# Patient Record
Sex: Female | Born: 1960 | Race: Black or African American | Hispanic: No | Smoking: Never smoker
Health system: Southern US, Community
[De-identification: ages and names within clinical notes are randomized; demographics above are authoritative.]

## PROBLEM LIST (undated history)

## (undated) DIAGNOSIS — K635 Polyp of colon: Secondary | ICD-10-CM

## (undated) DIAGNOSIS — R51 Headache: Secondary | ICD-10-CM

## (undated) DIAGNOSIS — T7840XA Allergy, unspecified, initial encounter: Secondary | ICD-10-CM

## (undated) DIAGNOSIS — H269 Unspecified cataract: Secondary | ICD-10-CM

## (undated) DIAGNOSIS — R0789 Other chest pain: Secondary | ICD-10-CM

## (undated) DIAGNOSIS — J45909 Unspecified asthma, uncomplicated: Secondary | ICD-10-CM

## (undated) DIAGNOSIS — F419 Anxiety disorder, unspecified: Secondary | ICD-10-CM

## (undated) DIAGNOSIS — N200 Calculus of kidney: Secondary | ICD-10-CM

## (undated) DIAGNOSIS — F32A Depression, unspecified: Secondary | ICD-10-CM

## (undated) DIAGNOSIS — F329 Major depressive disorder, single episode, unspecified: Secondary | ICD-10-CM

## (undated) HISTORY — DX: Anxiety disorder, unspecified: F41.9

## (undated) HISTORY — PX: COLONOSCOPY: SHX174

## (undated) HISTORY — DX: Allergy, unspecified, initial encounter: T78.40XA

## (undated) HISTORY — DX: Polyp of colon: K63.5

## (undated) HISTORY — DX: Unspecified cataract: H26.9

## (undated) HISTORY — PX: COLONOSCOPY W/ POLYPECTOMY: SHX1380

---

## 1985-01-14 HISTORY — PX: TUBAL LIGATION: SHX77

## 1997-01-14 HISTORY — PX: BUNIONECTOMY: SHX129

## 1999-04-12 ENCOUNTER — Encounter: Admission: RE | Admit: 1999-04-12 | Discharge: 1999-04-12 | Payer: Self-pay | Admitting: Family Medicine

## 1999-04-12 ENCOUNTER — Encounter: Payer: Self-pay | Admitting: Family Medicine

## 2001-09-07 ENCOUNTER — Encounter: Payer: Self-pay | Admitting: *Deleted

## 2001-09-07 ENCOUNTER — Encounter: Admission: RE | Admit: 2001-09-07 | Discharge: 2001-09-07 | Payer: Self-pay | Admitting: *Deleted

## 2002-09-09 ENCOUNTER — Encounter: Payer: Self-pay | Admitting: Emergency Medicine

## 2002-09-09 ENCOUNTER — Encounter: Admission: RE | Admit: 2002-09-09 | Discharge: 2002-09-09 | Payer: Self-pay | Admitting: Emergency Medicine

## 2003-09-15 ENCOUNTER — Encounter: Admission: RE | Admit: 2003-09-15 | Discharge: 2003-09-15 | Payer: Self-pay | Admitting: Internal Medicine

## 2004-09-14 ENCOUNTER — Encounter: Admission: RE | Admit: 2004-09-14 | Discharge: 2004-09-14 | Payer: Self-pay | Admitting: Emergency Medicine

## 2005-01-14 HISTORY — PX: CERVICAL POLYPECTOMY: SHX88

## 2005-09-18 ENCOUNTER — Encounter: Admission: RE | Admit: 2005-09-18 | Discharge: 2005-09-18 | Payer: Self-pay | Admitting: Family Medicine

## 2005-09-26 ENCOUNTER — Encounter: Admission: RE | Admit: 2005-09-26 | Discharge: 2005-09-26 | Payer: Self-pay | Admitting: Family Medicine

## 2006-03-19 ENCOUNTER — Ambulatory Visit (HOSPITAL_COMMUNITY): Admission: RE | Admit: 2006-03-19 | Discharge: 2006-03-19 | Payer: Self-pay | Admitting: Obstetrics and Gynecology

## 2006-03-19 ENCOUNTER — Encounter (INDEPENDENT_AMBULATORY_CARE_PROVIDER_SITE_OTHER): Payer: Self-pay | Admitting: Specialist

## 2006-09-23 ENCOUNTER — Encounter: Admission: RE | Admit: 2006-09-23 | Discharge: 2006-09-23 | Payer: Self-pay | Admitting: Internal Medicine

## 2007-09-24 ENCOUNTER — Encounter: Admission: RE | Admit: 2007-09-24 | Discharge: 2007-09-24 | Payer: Self-pay | Admitting: Emergency Medicine

## 2008-09-26 ENCOUNTER — Encounter: Admission: RE | Admit: 2008-09-26 | Discharge: 2008-09-26 | Payer: Self-pay | Admitting: Emergency Medicine

## 2009-09-27 ENCOUNTER — Encounter: Admission: RE | Admit: 2009-09-27 | Discharge: 2009-09-27 | Payer: Self-pay | Admitting: Emergency Medicine

## 2010-02-04 ENCOUNTER — Encounter: Payer: Self-pay | Admitting: Family Medicine

## 2010-04-15 DIAGNOSIS — R87619 Unspecified abnormal cytological findings in specimens from cervix uteri: Secondary | ICD-10-CM | POA: Insufficient documentation

## 2010-06-01 NOTE — Consult Note (Signed)
NAME:  Chelsea Mckay, [name]                ACCOUNT NO.:  [id number]   MEDICAL RECORD NO.:  [id number]          PATIENT TYPE:  AMB   LOCATION:                                FACILITY:  [facility]   PHYSICIAN:  Naima A. Dillard, M.D. DATE OF BIRTH:  08/05/1960   DATE OF CONSULTATION:  DATE OF DISCHARGE:                                 CONSULTATION   CHIEF COMPLAINT:  Endometrial endocervical polyp.   HISTORY OF PRESENT ILLNESS:  The patient is a 50 year old female who  came in because of cervical polyp that was found on exam by her primary  care physician.  She has some spotting after intercourse; otherwise no  irregular vaginal bleeding,.   PAST MEDICAL HISTORY:  Unremarkable.   MEDICATIONS:  Allegra as needed.   ALLERGIES:  No known drug allergies.   FAMILY HISTORY:  Significant for two sisters with breast cancer and  mother with diabetes.   PAST SURGICAL HISTORY:  Significant for tubal ligation, cryo and  bilateral foot surgery.   PAST MEDICAL HISTORY:  1. Vaginal delivery x3 without complaint.  2. Menarche occurred age 7, occurring monthly, lasting for five to      six days.   REVIEW OF SYSTEMS:  Significant for varicose veins. GU:  As above.  MUSCULOSKELETAL:  No muscle weakness.  RESPIRATORY:  No asthma.   PHYSICAL EXAMINATION:  VITAL SIGNS:  Weight 170 pounds, blood pressure  130/74.  HEENT:  Pupils are equal.  Hearing is normal.  Throat is clear.  NECK:  Thyroid is not enlarged.  HEART:  Regular rate and rhythm.  CHEST:  Clear to auscultation bilaterally.  BREASTS:  No masses, discharge, skin changes, or nipple retraction.  BACK:  No CVA tenderness bilaterally.  ABDOMEN:  Nontender without any masses or organomegaly.  EXTREMITIES:  No cyanosis, clubbing or edema.  NEUROLOGIC:  Within normal limits.  __________ vaginal exam is within normal limits.  Cervix is nontender  with a small endocervical polyp.  Uterus is normal in shape, size and  consistency.  Adnexa has no  masses.   The patient had a sonohystogram and ultrasound done.  Sonohystogram was  significant for posterior polyp that measured 0.6 cm.  Her ultrasound  was significant for uterus measuring 8.56 x 4.86 x 3.84. Both ovaries  were normal and the patient has four noted fibroids, the largest being  1.69 cm in size.  Cervical polypectomy was benign.   ASSESSMENT:  Uterine polyps.   PLAN:  Dilatation and curettage hysteroscopy, polypectomy.  The patient  understands the risks are but not limited to bleeding, infection,  perforation of the uterus, damage to internal organs such as bowel,  bladder, major blood vessels.      Naima A. Normand Sloop, M.D.  Electronically Signed     NAD/MEDQ  D:  03/19/2006  T:  03/19/2006  Job:  [id number]

## 2010-06-01 NOTE — Op Note (Signed)
NAME:  Chelsea Mckay, Chelsea Mckay                ACCOUNT NO.:  [id number]   MEDICAL RECORD NO.:  [id number]          PATIENT TYPE:  AMB   LOCATION:  [facility]                           FACILITY:  [facility]   PHYSICIAN:  Naima A. Dillard, M.D. DATE OF BIRTH:  [birth year]   DATE OF PROCEDURE:  03/19/2006  DATE OF DISCHARGE:                               OPERATIVE REPORT   PREOPERATIVE DIAGNOSES:  Uterine polyp and irregular vaginal bleeding.   POSTOPERATIVE DIAGNOSES:  Uterine polyp and irregular vaginal bleeding.   PROCEDURE:  Dilatation and curettage, hysteroscopy, polypectomy.   SURGEON:  Naima A. Dillard, M.D.   ASSISTANT:  None.   ANESTHESIA:  General laryngeal mask airway.   SPECIMENS:  Endometrial polyp and endometrial curettings sent to  pathology.   ESTIMATED BLOOD LOSS:  Minimal.   COMPLICATIONS:  None.  The patient went to PACU in stable condition.   DESCRIPTION OF PROCEDURE:  The patient was taken to the operating room  where she was given general anesthesia and placed in the dorsal  lithotomy position and prepped and draped in a normal sterile fashion.  The patient was noted to have an anteverted uterus with no adnexal  masses on examination under anesthesia.  Her bladder had been drained  with a straight catheter.  A bivalve speculum was placed into the  vagina.  The anterior lip of the cervix was grasped with a single tooth  tenaculum and 20 mL of one percent lidocaine used  for a cervical block.  The uterus was sounded to 9 cm.  The cervix was further dilated with  Shawnie Pons dilators of #21.  A hysteroscope was placed into the uterine  cavity.  Both ostia were visualized.  There was a small polyp on the  posterior aspect of the uterus.  The anterior uterine wall was normal.  Besides the polyp, there were no submucosal fibroids or other  abnormalities noted.  The polyp forceps were placed into the uterine  cavity and the polyp was removed.  A sharp curettage was done.  The  hysteroscope was  placed back into the uterine cavity and the polyp had  been successfully removed.  A second endometrial curettings were  obtained and sent to pathology.  All instruments were removed  from the vagina.  The sponge, lap and needle counts were correct.  The  tenaculum site on the patient's right side had some bleeding, which was  stopped with silver nitrate and pressure.   The patient went to the recovery room in stable condition.      Naima A. Normand Sloop, M.D.  Electronically Signed     NAD/MEDQ  D:  03/19/2006  T:  03/19/2006  Job:  [id number]

## 2010-08-24 ENCOUNTER — Other Ambulatory Visit: Payer: Self-pay | Admitting: Internal Medicine

## 2010-08-24 ENCOUNTER — Other Ambulatory Visit: Payer: Self-pay | Admitting: Emergency Medicine

## 2010-08-24 DIAGNOSIS — Z1231 Encounter for screening mammogram for malignant neoplasm of breast: Secondary | ICD-10-CM

## 2010-10-03 ENCOUNTER — Ambulatory Visit
Admission: RE | Admit: 2010-10-03 | Discharge: 2010-10-03 | Disposition: A | Discharge status: Home / Self Care | Payer: BC Managed Care – PPO | Source: Ambulatory Visit | Attending: Internal Medicine | Admitting: Internal Medicine

## 2010-10-03 DIAGNOSIS — Z1231 Encounter for screening mammogram for malignant neoplasm of breast: Secondary | ICD-10-CM

## 2011-02-11 ENCOUNTER — Ambulatory Visit (INDEPENDENT_AMBULATORY_CARE_PROVIDER_SITE_OTHER): Payer: BC Managed Care – PPO

## 2011-02-11 DIAGNOSIS — M542 Cervicalgia: Secondary | ICD-10-CM

## 2011-02-11 DIAGNOSIS — R319 Hematuria, unspecified: Secondary | ICD-10-CM

## 2011-07-28 ENCOUNTER — Other Ambulatory Visit: Payer: Self-pay

## 2011-07-28 MED ORDER — FEXOFENADINE HCL 180 MG PO TABS: 180.0000 mg | ORAL_TABLET | Freq: Every day | ORAL | Status: DC

## 2011-07-31 ENCOUNTER — Other Ambulatory Visit: Payer: Self-pay

## 2011-08-05 ENCOUNTER — Other Ambulatory Visit: Payer: Self-pay | Admitting: Obstetrics and Gynecology

## 2011-08-05 NOTE — Telephone Encounter (Signed)
Spoke with pt rgd msg wants refill on a cream for itching written in 2011 advised pt need ov offered pt an appt pt declined appt at this time

## 2011-08-05 NOTE — Telephone Encounter (Signed)
Niccole/nd pt °

## 2011-08-05 NOTE — Telephone Encounter (Signed)
Lm on vm tcb rgd msg 

## 2011-11-11 ENCOUNTER — Other Ambulatory Visit: Payer: Self-pay | Admitting: Emergency Medicine

## 2011-11-11 DIAGNOSIS — Z1231 Encounter for screening mammogram for malignant neoplasm of breast: Secondary | ICD-10-CM

## 2011-11-28 ENCOUNTER — Ambulatory Visit (INDEPENDENT_AMBULATORY_CARE_PROVIDER_SITE_OTHER): Payer: BC Managed Care – PPO | Admitting: Physician Assistant

## 2011-11-28 ENCOUNTER — Encounter: Payer: Self-pay | Admitting: Physician Assistant

## 2011-11-28 VITALS — BP 124/77 | HR 81 | Temp 98.4°F | Resp 16 | Ht 65.5 in | Wt 170.0 lb

## 2011-11-28 DIAGNOSIS — B353 Tinea pedis: Secondary | ICD-10-CM

## 2011-11-28 DIAGNOSIS — F430 Acute stress reaction: Secondary | ICD-10-CM

## 2011-11-28 DIAGNOSIS — R319 Hematuria, unspecified: Secondary | ICD-10-CM

## 2011-11-28 DIAGNOSIS — N029 Recurrent and persistent hematuria with unspecified morphologic changes: Secondary | ICD-10-CM

## 2011-11-28 DIAGNOSIS — J309 Allergic rhinitis, unspecified: Secondary | ICD-10-CM | POA: Insufficient documentation

## 2011-11-28 DIAGNOSIS — R739 Hyperglycemia, unspecified: Secondary | ICD-10-CM

## 2011-11-28 DIAGNOSIS — Z0000 Encounter for general adult medical examination without abnormal findings: Secondary | ICD-10-CM

## 2011-11-28 DIAGNOSIS — R7309 Other abnormal glucose: Secondary | ICD-10-CM

## 2011-11-28 LAB — POCT UA - MICROSCOPIC ONLY
Bacteria, U Microscopic: NEGATIVE
Mucus, UA: NEGATIVE
Yeast, UA: NEGATIVE

## 2011-11-28 LAB — CBC AND DIFFERENTIAL
Neutrophils Absolute: 2695 /uL
Platelets: 240 10*3/uL (ref 150–399)

## 2011-11-28 LAB — TSH: TSH: 1.4 u[IU]/mL (ref 0.41–5.90)

## 2011-11-28 LAB — BASIC METABOLIC PANEL
BUN: 13 mg/dL (ref 4–21)
Creatinine: 1 mg/dL (ref 0.5–1.1)
Glucose: 87 mg/dL

## 2011-11-28 LAB — POCT URINALYSIS DIPSTICK
Bilirubin, UA: NEGATIVE
Ketones, UA: NEGATIVE
Leukocytes, UA: NEGATIVE
Spec Grav, UA: >=1.03
pH, UA: 5

## 2011-11-28 LAB — LIPID PANEL: LDL Cholesterol: 109 mg/dL

## 2011-11-28 MED ORDER — NYSTATIN 100000 UNIT/GM EX POWD: Freq: Four times a day (QID) | CUTANEOUS | Status: DC

## 2011-11-28 NOTE — Progress Notes (Signed)
Subjective:    Patient ID: Chelsea Mckay, female    DOB: September 10, 1960, 51 y.o.   MRN: [id number]  HPI This 51 y.o. female presents for Annual Wellness exam.  Breast/pap performed with GYN (her appointment is in the next few weeks).    Review of Systems  Constitutional: Negative.   HENT: Positive for congestion, rhinorrhea and postnasal drip. Negative for hearing loss, ear pain, nosebleeds, sore throat, facial swelling, sneezing, drooling, mouth sores, trouble swallowing, neck pain, neck stiffness, dental problem, voice change, sinus pressure, tinnitus and ear discharge.   Eyes: Positive for discharge (watery) and itching. Negative for photophobia, pain, redness and visual disturbance.       Uses Allegra "when I need it," but hasn't been using it recently with her current symptoms.  Respiratory: Negative.   Cardiovascular: Negative.   Gastrointestinal: Negative.   Genitourinary: Negative.   Musculoskeletal: Negative.   Skin: Negative for color change, pallor, rash and wound.       Longstanding thick callous and peeling skin on both feet.  Notes a foul odor over the past few months.  Neurological: Negative.   Hematological: Negative.   Psychiatric/Behavioral: Positive for dysphoric mood (her husband died unexpectedly 2 weeks ago). Negative for suicidal ideas, hallucinations, behavioral problems, confusion, sleep disturbance, self-injury, decreased concentration and agitation. The patient is not nervous/anxious and is not hyperactive.       Past Medical History  Diagnosis Date  . Allergy     Past Surgical History  Procedure Date  . Tubal ligation 1987  . Bunionectomy 1999  . Cervical polypectomy 2007    Prior to Admission medications   Medication Sig Start Date End Date Taking? Authorizing Provider  fexofenadine (ALLEGRA) 180 MG tablet Take 1 tablet (180 mg total) by mouth daily. 07/28/11 07/27/12 Yes Jamon [place] Tessa Lerner, PA-C    Allergies  Allergen Reactions  . Adhesive (Tape)  Rash    History   Social History  . Marital Status: Widowed    Spouse Name: Shawn    Number of Children: 3  . Years of Education: 12   Occupational History  . Document Control Specialist    Social History Main Topics  . Smoking status: Never Smoker   . Smokeless tobacco: Never Used  . Alcohol Use: No  . Drug Use: No  . Sexually Active: Not Currently -- Female partner(s)   Other Topics Concern  . Not on file   Social History Narrative   Lives alone. Husband died unexpectedly of a heart attack at work 11/15/2011. Good support from pastor and daughters.    Family History  Problem Relation Age of Onset  . Diabetes Mother   . Alzheimer's disease Mother   . Cancer Sister   . Diabetes Brother   . Heart disease Father   . Cancer Sister 106    breast ca  . Diabetes Sister   . Hypertension Sister   . Cancer Sister 78    breast ca  . Diabetes Brother   . Cancer Brother 95    prostate  . Cancer Brother 31    prostate ca       Objective:   Physical Exam  Vitals reviewed. Constitutional: She is oriented to person, place, and time. Vital signs are normal. She appears well-developed and well-nourished. No distress.       Appears sad, becomes tearful when talking about her husband's recent death.  HENT:  Head: Normocephalic and atraumatic.  Right Ear: Hearing, tympanic membrane, external ear and  ear canal normal. No foreign bodies.  Left Ear: Hearing, tympanic membrane, external ear and ear canal normal. No foreign bodies.  Nose: Nose normal.  Mouth/Throat: Uvula is midline, oropharynx is clear and moist and mucous membranes are normal. No oral lesions. Normal dentition. No dental abscesses or uvula swelling. No oropharyngeal exudate.  Eyes: Conjunctivae normal and EOM are normal. Pupils are equal, round, and reactive to light. Right eye exhibits no discharge. Left eye exhibits no discharge. No scleral icterus.  Fundoscopic exam:      The right eye shows no arteriolar  narrowing, no AV nicking, no exudate, no hemorrhage and no papilledema. The right eye shows red reflex.The right eye shows no venous pulsations.      The left eye shows no arteriolar narrowing, no AV nicking, no exudate, no hemorrhage and no papilledema. The left eye shows red reflex.The left eye shows no venous pulsations. Neck: Trachea normal, normal range of motion and full passive range of motion without pain. Neck supple. No spinous process tenderness and no muscular tenderness present. No mass and no thyromegaly present.  Cardiovascular: Normal rate, regular rhythm, normal heart sounds, intact distal pulses and normal pulses.   Pulmonary/Chest: Effort normal and breath sounds normal.  Genitourinary: Rectum normal.  Musculoskeletal: She exhibits no edema and no tenderness.       Cervical back: Normal.       Thoracic back: Normal.       Lumbar back: Normal.  Lymphadenopathy:       Head (right side): No tonsillar, no preauricular, no posterior auricular and no occipital adenopathy present.       Head (left side): No tonsillar, no preauricular, no posterior auricular and no occipital adenopathy present.    She has no cervical adenopathy.       Right: No supraclavicular adenopathy present.       Left: No supraclavicular adenopathy present.  Neurological: She is alert and oriented to person, place, and time. She has normal strength and normal reflexes. No cranial nerve deficit. She exhibits normal muscle tone. Coordination and gait normal.  Skin: Skin is warm, dry and intact. No rash noted. She is not diaphoretic. No cyanosis or erythema. Nails show no clubbing.  Psychiatric: She has a normal mood and affect. Her speech is normal and behavior is normal. Judgment and thought content normal.    Results for orders placed in visit on 11/28/11  POCT URINALYSIS DIPSTICK      Component Value Range   Color, UA yellow     Clarity, UA clear     Glucose, UA neg     Bilirubin, UA neg     Ketones, UA  neg     Spec Grav, UA >=1.030     Blood, UA large     pH, UA 5.0     Protein, UA trace     Urobilinogen, UA 0.2     Nitrite, UA neg     Leukocytes, UA Negative    POCT UA - MICROSCOPIC ONLY      Component Value Range   WBC, Ur, HPF, POC 0-1     RBC, urine, microscopic 2-8     Bacteria, U Microscopic neg     Mucus, UA neg     Epithelial cells, urine per micros 0-6     Crystals, Ur, HPF, POC neg     Casts, Ur, LPF, POC neg     Yeast, UA neg         Assessment &  Plan:   1. Routine general medical examination at a health care facility  Age appropriate anticipatory guidance provided.   2. AR (allergic rhinitis)  Encouraged daily use of Allegra.  If symptoms persist, would add steroid nasal spray.  3. Benign hematuria  stable  4. Hyperglycemia  Await today's labs.    5. Tinea pedis  nystatin (MYCOSTATIN) powder  6. Reaction, situational, acute, to stress  Declined referral for therapy today.  Has excellent support.  Contact me if needed.

## 2011-11-28 NOTE — Patient Instructions (Signed)

## 2011-11-30 LAB — HEPATIC FUNCTION PANEL
ALT: 11 U/L (ref 7–35)
AST: 15 U/L (ref 13–35)
Alkaline Phosphatase: 70 U/L (ref 25–125)
Bilirubin, Total: 1.3 mg/dL

## 2011-12-04 ENCOUNTER — Encounter: Payer: Self-pay | Admitting: Physician Assistant

## 2011-12-16 ENCOUNTER — Ambulatory Visit
Admission: RE | Admit: 2011-12-16 | Discharge: 2011-12-16 | Disposition: A | Discharge status: Home / Self Care | Payer: BC Managed Care – PPO | Source: Ambulatory Visit | Attending: Emergency Medicine | Admitting: Emergency Medicine

## 2011-12-16 DIAGNOSIS — Z1231 Encounter for screening mammogram for malignant neoplasm of breast: Secondary | ICD-10-CM

## 2011-12-17 ENCOUNTER — Encounter: Payer: Self-pay | Admitting: Obstetrics and Gynecology

## 2011-12-17 ENCOUNTER — Ambulatory Visit (INDEPENDENT_AMBULATORY_CARE_PROVIDER_SITE_OTHER): Payer: BC Managed Care – PPO | Admitting: Obstetrics and Gynecology

## 2011-12-17 VITALS — BP 120/80 | Ht 65.0 in | Wt 170.0 lb

## 2011-12-17 DIAGNOSIS — N63 Unspecified lump in unspecified breast: Secondary | ICD-10-CM

## 2011-12-17 DIAGNOSIS — Z0000 Encounter for general adult medical examination without abnormal findings: Secondary | ICD-10-CM

## 2011-12-17 DIAGNOSIS — R21 Rash and other nonspecific skin eruption: Secondary | ICD-10-CM

## 2011-12-17 DIAGNOSIS — N6310 Unspecified lump in the right breast, unspecified quadrant: Secondary | ICD-10-CM

## 2011-12-17 DIAGNOSIS — Z124 Encounter for screening for malignant neoplasm of cervix: Secondary | ICD-10-CM

## 2011-12-17 MED ORDER — CLOTRIMAZOLE-BETAMETHASONE 1-0.05 % EX CREA: TOPICAL_CREAM | Freq: Two times a day (BID) | CUTANEOUS | Status: DC

## 2011-12-17 NOTE — Progress Notes (Signed)
Last Pap: 11/201 WNL: Yes Regular Periods:yes Contraception: n/a  Monthly Breast exam:yes Tetanus<53yrs:yes Nl.Bladder Function:yes Daily BMs:yes Healthy Diet:yes Calcium:yes Mammogram:yes Date of Mammogram: 12/16/11 have to re scan pt will call to schd Exercise:yes Have often Exercise: walk Seatbelt: yes Abuse at home: no Stressful work:no Sigmoid-colonoscopy: 2012 polyp re check in 3 yrs  Bone Density: No PCP: Chelle Jeffreies PAC Change in PMH: no change Change in FMH:no change BP 120/80  Ht 5\' 5"  (1.651 m)  Wt 170 lb (77.111 kg)  BMI 28.29 kg/m2  LMP 11/27/2011 Pt with complaints:yes she has occ missed period.  No other sxs.  She needs lotrisone for thigh rash and she needs to f/u at the breast center Physical Examination: General appearance - alert, well appearing, and in no distress Mental status - normal mood, behavior, speech, dress, motor activity, and thought processes Neck - supple, no significant adenopathy,  thyroid exam: thyroid is normal in size without nodules or tenderness Chest - clear to auscultation, no wheezes, rales or rhonchi, symmetric air entry Heart - normal rate and regular rhythm Abdomen - soft, nontender, nondistended, no masses or organomegaly Breasts - breasts appear normal, no suspicious masses, no skin or nipple changes or axillary nodes Pelvic - normal external genitalia, vulva, vagina, cervix, uterus and adnexa Rectal - declined by the patient Back exam - full range of motion, no tenderness, palpable spasm or pain on motion Neurological - alert, oriented, normal speech, no focal findings or movement disorder noted Musculoskeletal - no joint tenderness, deformity or swelling Extremities - no edema, redness or tenderness in the calves or thighs Skin - normal coloration and turgor, no rashes, no suspicious skin lesions noted Routine exam Will schedule follow up mamogram lotrisone prn rash on thigh Pt perimenopausal Pap sent yes Mammogram  due yes pt due for follow up of ? Mass in right breast abstinance used for contraception RT 1 yr

## 2011-12-17 NOTE — Patient Instructions (Signed)
Perimenopause Perimenopause is the time when your body begins to move into the menopause (no menstrual period for 12 straight months). It is a natural process. Perimenopause can begin 2 to 8 years before the menopause and usually lasts for one year after the menopause. During this time, your ovaries may or may not produce an egg. The ovaries vary in their production of estrogen and progesterone hormones each month. This can cause irregular menstrual periods, difficulty in getting pregnant, vaginal bleeding between periods and uncomfortable symptoms. CAUSES  Irregular production of the ovarian hormones, estrogen and progesterone, and not ovulating every month.  Other causes include:  Tumor of the pituitary gland in the brain.  Medical disease that affects the ovaries.  Radiation treatment.  Chemotherapy.  Unknown causes.  Heavy smoking and excessive alcohol intake can bring on perimenopause sooner. SYMPTOMS   Hot flashes.  Night sweats.  Irregular menstrual periods.  Decrease sex drive.  Vaginal dryness.  Headaches.  Mood swings.  Depression.  Memory problems.  Irritability.  Tiredness.  Weight gain.  Trouble getting pregnant.  The beginning of losing bone cells (osteoporosis).  The beginning of hardening of the arteries (atherosclerosis). DIAGNOSIS  Your caregiver will make a diagnosis by analyzing your age, menstrual history and your symptoms. They will do a physical exam noting any changes in your body, especially your female organs. Female hormone tests may or may not be helpful depending on the amount and when you produce the female hormones. However, other hormone tests may be helpful (ex. thyroid hormone) to rule out other problems. TREATMENT  The decision to treat during the perimenopause should be made by you and your caregiver depending on how the symptoms are affecting you and your life style. There are various treatments available such as:  Treating  individual symptoms with a specific medication for that symptom (ex. tranquilizer for depression).  Herbal medications that can help specific symptoms.  Counseling.  Group therapy.  No treatment. HOME CARE INSTRUCTIONS   Before seeing your caregiver, make a list of your menstrual periods (when the occur, how heavy they are, how long between periods and how long they last), your symptoms and when they started.  Take the medication as recommended by your caregiver.  Sleep and rest.  Exercise.  Eat a diet that contains calcium (good for your bones) and soy (acts like estrogen hormone).  Do not smoke.  Avoid alcoholic beverages.  Taking vitamin E may help in certain cases.  Take calcium and vitamin D supplements to help prevent bone loss.  Group therapy is sometimes helpful.  Acupuncture may help in some cases. SEEK MEDICAL CARE IF:   You have any of the above and want to know if it is perimenopause.  You want advice and treatment for any of your symptoms mentioned above.  You need a referral to a specialist (gynecologist, psychiatrist or psychologist). SEEK IMMEDIATE MEDICAL CARE IF:   You have vaginal bleeding.  Your period lasts longer than 8 days.  You periods are recurring sooner than 21 days.  You have bleeding after intercourse.  You have severe depression.  You have pain when you urinate.  You have severe headaches.  You develop vision problems. Document Released: 02/08/2004 Document Revised: 03/25/2011 Document Reviewed: 10/29/2007 Exit[employer] Patient Information 2013 [employer].  

## 2011-12-17 NOTE — Addendum Note (Signed)
Addended by: Rolla Plate on: 12/17/2011 11:07 AM   Modules accepted: Orders

## 2011-12-18 LAB — PAP IG W/ RFLX HPV ASCU

## 2011-12-25 ENCOUNTER — Ambulatory Visit
Admission: RE | Admit: 2011-12-25 | Discharge: 2011-12-25 | Disposition: A | Discharge status: Home / Self Care | Payer: BC Managed Care – PPO | Source: Ambulatory Visit | Attending: Obstetrics and Gynecology | Admitting: Obstetrics and Gynecology

## 2011-12-25 DIAGNOSIS — N6310 Unspecified lump in the right breast, unspecified quadrant: Secondary | ICD-10-CM

## 2012-01-07 ENCOUNTER — Telehealth: Payer: Self-pay | Admitting: Obstetrics and Gynecology

## 2012-01-07 ENCOUNTER — Encounter: Payer: Self-pay | Admitting: Obstetrics and Gynecology

## 2012-01-07 NOTE — Telephone Encounter (Signed)
Message copied by Mason Jim on Tue Jan 07, 2012  8:54 AM ------      Message from: Jaymes Graff      Created: Sun Dec 29, 2011  9:53 PM       Pt seen at breast center and considered high risk for breast cancer.  please offer her genetic testing and schedule if she accepts.  Also offer her a breast MRI to follow up right breast mass and risks of breast cancer over a lifr time is 23%

## 2012-01-07 NOTE — Progress Notes (Signed)
Pretest letter for Genetic Counseling appt 02/07/12 mailed.

## 2012-01-07 NOTE — Telephone Encounter (Signed)
TC to pt; Discussed recommendations of DR ND. Sched for genetic counseling 02/07/12.  Pt states was advised by Breast Center to wait until 2014 for MRI.  Pt will call to schedule and to have ordered at that time.

## 2012-01-16 ENCOUNTER — Encounter: Payer: Self-pay | Admitting: Obstetrics and Gynecology

## 2012-02-07 ENCOUNTER — Ambulatory Visit: Payer: BC Managed Care – PPO

## 2012-02-17 ENCOUNTER — Ambulatory Visit (INDEPENDENT_AMBULATORY_CARE_PROVIDER_SITE_OTHER): Payer: BC Managed Care – PPO | Admitting: Family Medicine

## 2012-02-17 ENCOUNTER — Encounter: Payer: Self-pay | Admitting: Family Medicine

## 2012-02-17 VITALS — BP 124/72 | HR 65 | Temp 98.0°F | Resp 16 | Ht 64.5 in | Wt 162.4 lb

## 2012-02-17 DIAGNOSIS — K602 Anal fissure, unspecified: Secondary | ICD-10-CM

## 2012-02-17 LAB — IFOBT (OCCULT BLOOD): IFOBT: NEGATIVE

## 2012-02-17 NOTE — Progress Notes (Signed)
[facility] [address], [place] [address] [phone]  Date:  02/17/2012   Name:  Chelsea Mckay   DOB:  04-09-1960   MRN:  [id number]  PCP:  Pcp Not In System    Chief Complaint: Rectal Bleeding   History of Present Illness:  Chelsea Mckay is a 52 y.o. very pleasant female patient who presents with the following:  Today is Monday- on Saturday she had a BM and then noted some bleeding. She had bright red blood on the TP.  The BM was was painful- hard and difficult to pass.  The same thing happened yesterday and today.   She had a colonoscopy done in 2012- she had 3 polyps removed.  She was told to follow- up in 3- 5 years; Dr. Elnoria Howard.  Otherwise she is feeling ok- she does note a bruise on her left leg but no other unusual bleeding or bruising.   She is s/p tubal ligaation, and has irregular menses- perimenopausal. He is sure that her bleeding was rectal, not vaginal  Patient Active Problem List  Diagnosis  . AR (allergic rhinitis)  . Benign hematuria  . Hyperglycemia  . Breast mass in female    Past Medical History  Diagnosis Date  . Allergy     Past Surgical History  Procedure Date  . Tubal ligation 1987  . Bunionectomy 1999  . Cervical polypectomy 2007    History  Substance Use Topics  . Smoking status: Never Smoker   . Smokeless tobacco: Never Used  . Alcohol Use: No    Family History  Problem Relation Age of Onset  . Diabetes Mother   . Alzheimer's disease Mother   . Cancer Sister   . Diabetes Brother   . Heart disease Father   . Cancer Sister 44    breast ca  . Diabetes Sister   . Hypertension Sister   . Cancer Sister 65    breast ca  . Diabetes Brother   . Cancer Brother 29    prostate  . Cancer Brother 71    prostate ca    Allergies  Allergen Reactions  . Adhesive (Tape) Rash    Medication list has been reviewed and updated.  Current Outpatient Prescriptions on File Prior to Visit  Medication Sig Dispense  Refill  . clotrimazole-betamethasone (LOTRISONE) cream Apply topically 2 (two) times daily.  30 g  0  . fexofenadine (ALLEGRA) 180 MG tablet Take 1 tablet (180 mg total) by mouth daily.  90 tablet  1  . nystatin (MYCOSTATIN) powder Apply topically 4 (four) times daily.  60 g  2    Review of Systems:  As per HPI- otherwise negative.   Physical Examination: Filed Vitals:   02/17/12 1201  BP: 124/72  Pulse: 65  Temp: 98 F (36.7 C)  Resp: 16   Filed Vitals:   02/17/12 1201  Height: 5' 4.5" (1.638 m)  Weight: 162 lb 6.4 oz (73.664 kg)   Body mass index is 27.45 kg/(m^2). Ideal Body Weight: Weight in (lb) to have BMI = 25: 147.6   GEN: WDWN, NAD, Non-toxic, A & O x 3 HEENT: Atraumatic, Normocephalic. Neck supple. No masses, No LAD. Ears and Nose: No external deformity. CV: RRR, No M/G/R. No JVD. No thrill. No extra heart sounds. PULM: CTA B, no wheezes, crackles, rhonchi. No retractions. No resp. distress. No accessory muscle use. ABD: S, NT, ND. No rebound. No HSM. Dime sized bruise on left  thigh EXTR: No c/c/e NEURO Normal gait.  PSYCH: Normally interactive. Conversant. Not depressed or anxious appearing.  Calm demeanor.  Rectal: she has a tender, fissured area at 6:00.  No bleeding, DRE normal  Results for orders placed in visit on 02/17/12  IFOBT (OCCULT BLOOD)      Component Value Range   IFOBT Negative      Assessment and Plan: 1. Anal fissure  IFOBT POC (occult bld, rslt in office)   Aelyn is UTD on her colonoscopy. She describes typical anal fissure symptoms with bleeding.   Discussed methods to keep her stool soft (increased water intake, juice) and colace.  She will let us know if these symptoms persist.    Abbe Amsterdam, MD

## 2012-02-17 NOTE — Patient Instructions (Addendum)
Work on keeping your stools soft (start colace OTC) and use the topical pain reliever as needed.  If your problem continues please let us know   Anal Fissure, Adult An anal fissure is a small tear or crack in the skin around the anus. Bleeding from a fissure usually stops on its own within a few minutes. However, bleeding will often reoccur with each bowel movement until the crack heals.  CAUSES   Passing large, hard stools.  Frequent diarrheal stools.  Constipation.  Inflammatory bowel disease (Crohn's disease or ulcerative colitis).  Infections.  Anal sex. SYMPTOMS   Small amounts of blood seen on your stools, on toilet paper, or in the toilet after a bowel movement.  Rectal bleeding.  Painful bowel movements.  Itching or irritation around the anus. DIAGNOSIS Your caregiver will examine the anal area. An anal fissure can usually be seen with careful inspection. A rectal exam may be performed and a short tube (anoscope) may be used to examine the anal canal. TREATMENT   You may be instructed to take fiber supplements. These supplements can soften your stool to help make bowel movements easier.  Sitz baths may be recommended to help heal the tear. Do not use soap in the sitz baths.  A medicated cream or ointment may be prescribed to lessen discomfort. HOME CARE INSTRUCTIONS   Maintain a diet high in fruits, whole grains, and vegetables. Avoid constipating foods like bananas and dairy products.  Take sitz baths as directed by your caregiver.  Drink enough fluids to keep your urine clear or pale yellow.  Only take over-the-counter or prescription medicines for pain, discomfort, or fever as directed by your caregiver. Do not take aspirin as this may increase bleeding.  Do not use ointments containing numbing medications (anesthetics) or hydrocortisone. They could slow healing. SEEK MEDICAL CARE IF:   Your fissure is not completely healed within 3 days.  You have  further bleeding.  You have a fever.  You have diarrhea mixed with blood.  You have pain.  Your problem is getting worse rather than better. MAKE SURE YOU:   Understand these instructions.  Will watch your condition.  Will get help right away if you are not doing well or get worse. Document Released: 12/31/2004 Document Revised: 03/25/2011 Document Reviewed: 06/17/2010 [facility] Patient Information 2013 [place], [place].

## 2012-02-29 ENCOUNTER — Other Ambulatory Visit: Payer: Self-pay

## 2012-03-23 ENCOUNTER — Telehealth: Payer: Self-pay | Admitting: Obstetrics and Gynecology

## 2012-03-23 NOTE — Telephone Encounter (Signed)
TC to pt.  Pt had cancelled genetic counseling appt. Also had previously declined MRI. Consulted with pt if insurance had changed or if she wished to schedule counseling or MRI.  Pt continues to decline.

## 2012-05-27 ENCOUNTER — Encounter: Payer: Self-pay | Admitting: Family Medicine

## 2012-06-04 ENCOUNTER — Ambulatory Visit (INDEPENDENT_AMBULATORY_CARE_PROVIDER_SITE_OTHER): Payer: BC Managed Care – PPO | Admitting: Physician Assistant

## 2012-06-04 VITALS — BP 140/80 | HR 86 | Temp 98.0°F | Resp 16 | Ht 65.0 in | Wt 163.0 lb

## 2012-06-04 DIAGNOSIS — J3489 Other specified disorders of nose and nasal sinuses: Secondary | ICD-10-CM

## 2012-06-04 DIAGNOSIS — R059 Cough, unspecified: Secondary | ICD-10-CM

## 2012-06-04 DIAGNOSIS — R05 Cough: Secondary | ICD-10-CM

## 2012-06-04 DIAGNOSIS — J309 Allergic rhinitis, unspecified: Secondary | ICD-10-CM

## 2012-06-04 DIAGNOSIS — R0981 Nasal congestion: Secondary | ICD-10-CM

## 2012-06-04 MED ORDER — PREDNISONE 20 MG PO TABS: ORAL_TABLET | ORAL | Status: DC

## 2012-06-04 MED ORDER — BENZONATATE 100 MG PO CAPS: 100.0000 mg | ORAL_CAPSULE | Freq: Three times a day (TID) | ORAL | Status: DC | PRN

## 2012-06-04 MED ORDER — ALBUTEROL SULFATE HFA 108 (90 BASE) MCG/ACT IN AERS: 2.0000 | INHALATION_SPRAY | RESPIRATORY_TRACT | Status: DC | PRN

## 2012-06-04 MED ORDER — HYDROCODONE-HOMATROPINE 5-1.5 MG/5ML PO SYRP: 5.0000 mL | ORAL_SOLUTION | Freq: Three times a day (TID) | ORAL | Status: DC | PRN

## 2012-06-04 NOTE — Progress Notes (Signed)
  Subjective:    Patient ID: Chelsea Mckay, female    DOB: [birth year], 52 y.o.   MRN: [id number]  HPI 52 year old female presents with 5 day history of dry, hacking cough, PND, rhinorrhea, and congestion.  States symptoms have been intermittent since February at the onset of allergy season.  Does have a history of environmental allergies.  Was doing well controlling sx's with Allegra until 5 days ago when she was exposed to a lot of cigarette smoke.  Since then has been coughing nonstop and has had copious amounts of PND.  Denies fever, chills, nausea vomiting, abdominal pain, headache, sinus pain, otalgia, sore throat, or dizziness.  She is otherwise healthy.     Review of Systems  Constitutional: Negative for fever and chills.  HENT: Positive for congestion, rhinorrhea and postnasal drip. Negative for ear pain and sore throat.   Respiratory: Positive for cough and wheezing. Negative for shortness of breath.   Gastrointestinal: Negative for nausea, vomiting and abdominal pain.  Neurological: Negative for dizziness and headaches.       Objective:   Physical Exam  Constitutional: She is oriented to person, place, and time. She appears well-developed and well-nourished.  HENT:  Head: Normocephalic and atraumatic.  Right Ear: Hearing, tympanic membrane, external ear and ear canal normal.  Left Ear: Hearing, tympanic membrane, external ear and ear canal normal.  Mouth/Throat: Uvula is midline, oropharynx is clear and moist and mucous membranes are normal. No oropharyngeal exudate.  Eyes: Conjunctivae are normal.  Neck: Normal range of motion. Neck supple.  Cardiovascular: Normal rate, regular rhythm and normal heart sounds.   Pulmonary/Chest: Effort normal and breath sounds normal.  Lymphadenopathy:    She has no cervical adenopathy.  Neurological: She is alert and oriented to person, place, and time.  Psychiatric: She has a normal mood and affect. Her behavior is normal. Judgment and  thought content normal.          Assessment & Plan:  Cough - Plan: albuterol (PROVENTIL HFA;VENTOLIN HFA) 108 (90 BASE) MCG/ACT inhaler, benzonatate (TESSALON) 100 MG capsule, HYDROcodone-homatropine (HYCODAN) 5-1.5 MG/5ML syrup, predniSONE (DELTASONE) 20 MG tablet  Nasal congestion  Allergic rhinitis - Plan: predniSONE (DELTASONE) 20 MG tablet  Prednisone taper Tessalon perles tid prn cough Hycodan qhs prn cough Continue Allegra daily Increase fluids and rest Follow up if symptoms worsen or fail to improve.

## 2012-11-19 ENCOUNTER — Other Ambulatory Visit: Payer: Self-pay

## 2012-11-23 ENCOUNTER — Other Ambulatory Visit: Payer: Self-pay

## 2012-11-23 DIAGNOSIS — Z1231 Encounter for screening mammogram for malignant neoplasm of breast: Secondary | ICD-10-CM

## 2012-12-25 ENCOUNTER — Ambulatory Visit: Payer: BC Managed Care – PPO

## 2012-12-28 ENCOUNTER — Ambulatory Visit
Admission: RE | Admit: 2012-12-28 | Discharge: 2012-12-28 | Disposition: A | Discharge status: Home / Self Care | Payer: BC Managed Care – PPO | Source: Ambulatory Visit

## 2012-12-28 DIAGNOSIS — Z1231 Encounter for screening mammogram for malignant neoplasm of breast: Secondary | ICD-10-CM

## 2013-09-14 ENCOUNTER — Other Ambulatory Visit (HOSPITAL_COMMUNITY): Payer: Self-pay | Admitting: Obstetrics and Gynecology

## 2013-09-14 NOTE — H&P (Signed)
Chelsea Mckay is a 53 y.o. female P 3-0-0-3 for hysteroscopy, dilatation, curettage and resection of endometrial polyp because of metrorrhagia.  The patient reports monthly periods until 2007 when she began skipping her periods  up to 2 months at a time.  This past year, prior to February 2015 she has skipped 6 months then began to bleed for 10 days.  There was spotting and heavier bleeding requiring a pad change every 2-3 hours (with clots).  She denies any significant cramping.  She goes on to say that she hasn't had any changes in bowel or bladder function and no dyspareunia.  A TSH, Prolactin and CBC done in February 2015 were normal as was an endometrial biopsy showing atrophic endometrium.  A sono-hysterogram done at that same time showed an endometrium = 2.18 mm with 0.7 cm x 0.40 cm endometrial mass with saline infusion- no flow is seen on color Doppler though polyp couldn't be ruled out.   Both ovaries appeared normal on that study and  endometrial cavity width = 2.8cm/ length  = 4.0cm respectively.  A review of both medical and surgical management options were given to the patient, to include observation, however, she has decided to proceed with surgical resection of her endometrial mass.   Past Medical History  OB History: G: 3; P: 3-0-0-3; . SVD: 1980, 1981 and 1987  GYN History: menarche: 53 YO    LMP: 08/03/2013    Contracepton bilateral tubal ligation  The patient denies history of sexually transmitted disease.  Remote history of abnormal PAP smear in 1988 treated with cryotherapy.   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 Adhesive [Tape] Rash  Lactose Intolerant   Denies sensitivity to peanuts, shellfish, soy, or  latex   ROS: Admits to reading  glasses, partial upper dental plate, occasional headaches with weather changes, occasional chest pain with anxiety, right knee swelling and rash in groin;  Denies, vision changes, nasal congestion, dysphagia, tinnitus, dizziness, hoarseness, cough, shortness of breath, nausea, vomiting, diarrhea,constipation,  urinary frequency, urgency  dysuria, hematuria, vaginitis symptoms, pelvic pain,easy bruising,  myalgias, skin rashes, unexplained weight loss and except as is mentioned in the history of present illness, patient's review of systems is otherwise negative.   Physical Exam  Bp: 126/70   P: 72 bpm  R: 18  Temperature: 98.4 degrees F orally Weight: 168 lbs.  Height: 5'5" BMI: 28  Neck: supple without masses or thyromegaly Lungs: clear to auscultation Heart: regular rate and rhythm Abdomen: soft, non-tender and no organomegaly Pelvic:EGBUS- wnl; vagina-normal rugae; uterus-normal size, cervix without lesions or motion tenderness; adnexae-no tenderness or masses Extremities:  no clubbing, cyanosis or edema   Assesment: Menometrorrhagia           Endometrial Masses   Disposition:  A discussion was held with patient regarding the indication  for her procedure(s) along with the risks, which include but are not limited to: reaction to anesthesia, damage to adjacent organs, infection and excessive bleeding. The patient verbalized understanding of these risks and has consented to proceed with Hysteroscopy, Dilatation,  Curettage and Resection of Endometrial Polyp at [place] on September 24, 2012.   CSN# [id number]   Jerone Cudmore J. Florene Glen, PA-C  for Dr. Franklyn Lor. Dillard

## 2013-09-15 ENCOUNTER — Encounter (HOSPITAL_COMMUNITY): Payer: Self-pay

## 2013-09-15 ENCOUNTER — Encounter (HOSPITAL_COMMUNITY)
Admission: RE | Admit: 2013-09-15 | Discharge: 2013-09-15 | Disposition: A | Discharge status: Home / Self Care | Payer: BC Managed Care – PPO | Source: Ambulatory Visit | Attending: Obstetrics and Gynecology | Admitting: Obstetrics and Gynecology

## 2013-09-15 ENCOUNTER — Other Ambulatory Visit: Payer: Self-pay | Admitting: Obstetrics and Gynecology

## 2013-09-15 DIAGNOSIS — N950 Postmenopausal bleeding: Secondary | ICD-10-CM | POA: Insufficient documentation

## 2013-09-15 DIAGNOSIS — N840 Polyp of corpus uteri: Secondary | ICD-10-CM | POA: Insufficient documentation

## 2013-09-15 DIAGNOSIS — Z01818 Encounter for other preprocedural examination: Secondary | ICD-10-CM | POA: Diagnosis present

## 2013-09-15 HISTORY — DX: Major depressive disorder, single episode, unspecified: F32.9

## 2013-09-15 HISTORY — DX: Depression, unspecified: F32.A

## 2013-09-15 HISTORY — DX: Headache: R51

## 2013-09-15 HISTORY — DX: Other chest pain: R07.89

## 2013-09-15 HISTORY — DX: Unspecified asthma, uncomplicated: J45.909

## 2013-09-15 HISTORY — DX: Calculus of kidney: N20.0

## 2013-09-15 LAB — CBC
HCT: 40.7 % (ref 36.0–46.0)
Hemoglobin: 13.9 g/dL (ref 12.0–15.0)
MCH: 31.5 pg (ref 26.0–34.0)
MCHC: 34.2 g/dL (ref 30.0–36.0)
MCV: 92.3 fL (ref 78.0–100.0)
PLATELETS: 235 10*3/uL (ref 150–400)
RBC: 4.41 MIL/uL (ref 3.87–5.11)
RDW: 12.5 % (ref 11.5–15.5)
WBC: 5.5 10*3/uL (ref 4.0–10.5)

## 2013-09-15 NOTE — Patient Instructions (Addendum)
Your procedure is scheduled on: Friday, September 24, 2013  Enter through the Main Entrance of [facility] at: 8:45 am  Pick up the phone at the desk and dial [phone].  Call this number if you have problems the morning of surgery: [phone].  Remember: Do NOT eat food: AFTER MIDNIGHT September 23, 2013 Do NOT drink clear liquids after: AFTER MIDNIGHT September 23, 2013 Take these medicines the morning of surgery with a SIP OF WATER: none *Bring asthma inhaler day of surgery  Do NOT wear jewelry (body piercing), metal hair clips/bobby pins, make-up, or nail polish. Do NOT wear lotions, powders, or perfumes.  You may wear deoderant. Do NOT shave for 48 hours prior to surgery. Do NOT bring valuables to the hospital. Contacts, dentures, or bridgework may not be worn into surgery.  Have a responsible adult drive you home and stay with you for 24 hours after your procedure  Bring a copy of living will. Thank you

## 2013-09-16 ENCOUNTER — Encounter (HOSPITAL_COMMUNITY): Payer: Self-pay | Admitting: Pharmacist

## 2013-09-24 ENCOUNTER — Encounter (HOSPITAL_COMMUNITY): Payer: BC Managed Care – PPO | Admitting: Anesthesiology

## 2013-09-24 ENCOUNTER — Ambulatory Visit (HOSPITAL_COMMUNITY): Payer: BC Managed Care – PPO | Admitting: Anesthesiology

## 2013-09-24 ENCOUNTER — Encounter (HOSPITAL_COMMUNITY): Payer: Self-pay | Admitting: Anesthesiology

## 2013-09-24 ENCOUNTER — Encounter (HOSPITAL_COMMUNITY)
Admission: RE | Disposition: A | Discharge status: Home / Self Care | Payer: Self-pay | Source: Ambulatory Visit | Attending: Obstetrics and Gynecology

## 2013-09-24 ENCOUNTER — Ambulatory Visit (HOSPITAL_COMMUNITY)
Admission: RE | Admit: 2013-09-24 | Discharge: 2013-09-24 | Disposition: A | Discharge status: Home / Self Care | Payer: BC Managed Care – PPO | Source: Ambulatory Visit | Attending: Obstetrics and Gynecology | Admitting: Obstetrics and Gynecology

## 2013-09-24 DIAGNOSIS — N950 Postmenopausal bleeding: Secondary | ICD-10-CM | POA: Diagnosis not present

## 2013-09-24 DIAGNOSIS — N924 Excessive bleeding in the premenopausal period: Secondary | ICD-10-CM

## 2013-09-24 DIAGNOSIS — N840 Polyp of corpus uteri: Secondary | ICD-10-CM | POA: Insufficient documentation

## 2013-09-24 DIAGNOSIS — N841 Polyp of cervix uteri: Secondary | ICD-10-CM | POA: Insufficient documentation

## 2013-09-24 HISTORY — PX: DILATATION & CURETTAGE/HYSTEROSCOPY WITH TRUECLEAR: SHX6353

## 2013-09-24 LAB — PREGNANCY, URINE: Preg Test, Ur: NEGATIVE

## 2013-09-24 SURGERY — DILATATION & CURETTAGE/HYSTEROSCOPY WITH TRUCLEAR
Anesthesia: General

## 2013-09-24 MED ORDER — ONDANSETRON HCL 4 MG/2ML IJ SOLN
INTRAMUSCULAR | Status: AC
  Filled 2013-09-24: qty 2

## 2013-09-24 MED ORDER — METOCLOPRAMIDE HCL 5 MG/ML IJ SOLN
INTRAMUSCULAR | Status: AC
  Administered 2013-09-24: 10 mg via INTRAVENOUS
  Filled 2013-09-24: qty 2

## 2013-09-24 MED ORDER — FENTANYL CITRATE 0.05 MG/ML IJ SOLN
INTRAMUSCULAR | Status: AC
  Filled 2013-09-24: qty 5

## 2013-09-24 MED ORDER — LIDOCAINE HCL (CARDIAC) 20 MG/ML IV SOLN
INTRAVENOUS | Status: AC
  Filled 2013-09-24: qty 5

## 2013-09-24 MED ORDER — SCOPOLAMINE 1 MG/3DAYS TD PT72
MEDICATED_PATCH | TRANSDERMAL | Status: AC
  Filled 2013-09-24: qty 1

## 2013-09-24 MED ORDER — KETOROLAC TROMETHAMINE 30 MG/ML IJ SOLN
INTRAMUSCULAR | Status: AC
  Filled 2013-09-24: qty 1

## 2013-09-24 MED ORDER — KETOROLAC TROMETHAMINE 30 MG/ML IJ SOLN: 15.0000 mg | Freq: Once | INTRAMUSCULAR | Status: DC | PRN

## 2013-09-24 MED ORDER — MIDAZOLAM HCL 2 MG/2ML IJ SOLN
INTRAMUSCULAR | Status: DC | PRN
  Administered 2013-09-24: 2 mg via INTRAVENOUS

## 2013-09-24 MED ORDER — LIDOCAINE HCL 2 % IJ SOLN
INTRAMUSCULAR | Status: DC | PRN
  Administered 2013-09-24: 20 mL

## 2013-09-24 MED ORDER — METOCLOPRAMIDE HCL 5 MG/ML IJ SOLN
10.0000 mg | Freq: Once | INTRAMUSCULAR | Status: AC
  Administered 2013-09-24: 10 mg via INTRAVENOUS

## 2013-09-24 MED ORDER — LIDOCAINE HCL 2 % IJ SOLN
INTRAMUSCULAR | Status: AC
  Filled 2013-09-24: qty 20

## 2013-09-24 MED ORDER — FENTANYL CITRATE 0.05 MG/ML IJ SOLN
25.0000 ug | INTRAMUSCULAR | Status: DC | PRN
  Administered 2013-09-24: 50 ug via INTRAVENOUS

## 2013-09-24 MED ORDER — ONDANSETRON HCL 4 MG/2ML IJ SOLN
INTRAMUSCULAR | Status: DC | PRN
  Administered 2013-09-24: 4 mg via INTRAVENOUS

## 2013-09-24 MED ORDER — DEXAMETHASONE SODIUM PHOSPHATE 10 MG/ML IJ SOLN
INTRAMUSCULAR | Status: DC | PRN
  Administered 2013-09-24: 4 mg via INTRAVENOUS

## 2013-09-24 MED ORDER — SILVER NITRATE-POT NITRATE 75-25 % EX MISC
CUTANEOUS | Status: DC | PRN
  Administered 2013-09-24: 1 via TOPICAL

## 2013-09-24 MED ORDER — MEPERIDINE HCL 25 MG/ML IJ SOLN: 6.2500 mg | INTRAMUSCULAR | Status: DC | PRN

## 2013-09-24 MED ORDER — PROPOFOL 10 MG/ML IV BOLUS
INTRAVENOUS | Status: DC | PRN
  Administered 2013-09-24: 200 mg via INTRAVENOUS

## 2013-09-24 MED ORDER — LIDOCAINE HCL (CARDIAC) 20 MG/ML IV SOLN
INTRAVENOUS | Status: DC | PRN
  Administered 2013-09-24: 80 mg via INTRAVENOUS

## 2013-09-24 MED ORDER — FENTANYL CITRATE 0.05 MG/ML IJ SOLN
INTRAMUSCULAR | Status: DC | PRN
  Administered 2013-09-24 (×2): 50 ug via INTRAVENOUS

## 2013-09-24 MED ORDER — HYDROCODONE-ACETAMINOPHEN 5-325 MG PO TABS: 1.0000 | ORAL_TABLET | Freq: Four times a day (QID) | ORAL | Status: DC | PRN

## 2013-09-24 MED ORDER — LACTATED RINGERS IV SOLN
INTRAVENOUS | Status: DC
  Administered 2013-09-24 (×2): via INTRAVENOUS

## 2013-09-24 MED ORDER — DEXAMETHASONE SODIUM PHOSPHATE 10 MG/ML IJ SOLN
INTRAMUSCULAR | Status: AC
  Filled 2013-09-24: qty 1

## 2013-09-24 MED ORDER — PROPOFOL 10 MG/ML IV EMUL
INTRAVENOUS | Status: AC
  Filled 2013-09-24: qty 20

## 2013-09-24 MED ORDER — IBUPROFEN 600 MG PO TABS: 600.0000 mg | ORAL_TABLET | Freq: Four times a day (QID) | ORAL | Status: DC | PRN

## 2013-09-24 MED ORDER — ONDANSETRON HCL 4 MG/2ML IJ SOLN: 4.0000 mg | Freq: Once | INTRAMUSCULAR | Status: DC | PRN

## 2013-09-24 MED ORDER — KETOROLAC TROMETHAMINE 30 MG/ML IJ SOLN
INTRAMUSCULAR | Status: DC | PRN
  Administered 2013-09-24: 30 mg via INTRAVENOUS

## 2013-09-24 MED ORDER — MIDAZOLAM HCL 2 MG/2ML IJ SOLN
INTRAMUSCULAR | Status: AC
  Filled 2013-09-24: qty 2

## 2013-09-24 MED ORDER — SCOPOLAMINE 1 MG/3DAYS TD PT72: 1.0000 | MEDICATED_PATCH | Freq: Once | TRANSDERMAL | Status: DC

## 2013-09-24 MED ORDER — FENTANYL CITRATE 0.05 MG/ML IJ SOLN
INTRAMUSCULAR | Status: AC
  Administered 2013-09-24: 50 ug via INTRAVENOUS
  Filled 2013-09-24: qty 2

## 2013-09-24 SURGICAL SUPPLY — 19 items
BLADE INCISOR TRUC PLUS 2.9 (ABLATOR) ×1 IMPLANT
CANISTERS HI-FLOW 3000CC (CANNISTER) ×3 IMPLANT
CATH ROBINSON RED A/P 16FR (CATHETERS) ×3 IMPLANT
CLOTH BEACON ORANGE TIMEOUT ST (SAFETY) ×3 IMPLANT
CONTAINER PREFILL 10% NBF 60ML (FORM) ×6 IMPLANT
DRAPE HYSTEROSCOPY (DRAPE) ×3 IMPLANT
GLOVE BIO SURGEON STRL SZ 6.5 (GLOVE) ×2 IMPLANT
GLOVE BIO SURGEONS STRL SZ 6.5 (GLOVE) ×1
GLOVE BIOGEL PI IND STRL 7.0 (GLOVE) ×1 IMPLANT
GLOVE BIOGEL PI INDICATOR 7.0 (GLOVE) ×2
GOWN STRL REUS W/TWL LRG LVL3 (GOWN DISPOSABLE) ×6 IMPLANT
INCISOR TRUC PLUS BLADE 2.9 (ABLATOR) ×3
KIT HYSTEROSCOPY TRUCLEAR (ABLATOR) IMPLANT
MORCELLATOR RECIP TRUCLEAR 4.0 (ABLATOR) IMPLANT
PACK VAGINAL MINOR WOMEN LF (CUSTOM PROCEDURE TRAY) ×3 IMPLANT
PAD OB MATERNITY 4.3X12.25 (PERSONAL CARE ITEMS) ×3 IMPLANT
SYRINGE 20CC LL (MISCELLANEOUS) ×3 IMPLANT
TOWEL OR 17X24 6PK STRL BLUE (TOWEL DISPOSABLE) ×6 IMPLANT
WATER STERILE IRR 1000ML POUR (IV SOLUTION) ×3 IMPLANT

## 2013-09-24 NOTE — Op Note (Signed)
Pre op NI:OEVO and endometrial polyps   Post Op JJ:[id number] polyp and PMVB   PHYSICIAN : Alline Pio   ASSISTANTS: none   ANESTHESIA:   General LMA and paracervical block  ESTIMATED BLOOD LOSS: minimal  LOCAL MEDICATIONS USED:  LIDOCAINE 20CC  SPECIMEN:  Source of Specimen:  endometrial curettings and endocervical polyp  DISPOSITION OF SPECIMEN:  PATHOLOGY  COUNTS Correct:  YES    DICTATION #: The patient was taken to the operating room and prepped and draped in a normal sterile fashion. An in out catheter was used to drain the bladder.   A bivalve speculum was placed into the vagina and anterior lip of the cervix was grasped with a single-tooth tenaculum.  20 cc of 1% lidocaine was used for cervical block.  the cervix was then dilated with Kennon Rounds dilators up to 21. The hysteroscope was placed into the uterine cavity. The  entire uterus and both ostia were visualized. Using trueclear I removed the endocervical polyp.    Hyseroscope was then removed from the uterus. A sharp curettage was then done with a curette and endometrial curettings were obtained. The endometrial curettings were sent to pathology. Again the hysteroscope was placed into the uterine cavity. Both ostia were again visualized there were no polyps or submucosal fibroids or endometrial masses seen. The tenaculum was removed from the cervix and hemostasis was noted.   PLAN OF CARE: discharge to home  PATIENT DISPOSITION:  PACU - hemodynamically stable.

## 2013-09-24 NOTE — Anesthesia Postprocedure Evaluation (Signed)
Anesthesia Post Note  Patient: Chelsea Mckay  Procedure(s) Performed: Procedure(s) (LRB): DILATATION & CURETTAGE/HYSTEROSCOPY WITH TRUCLEAR and Endometrial Ablation (N/A)  Anesthesia type: General  Patient location: PACU  Post pain: Pain level controlled  Post assessment: Post-op Vital signs reviewed  Last Vitals:  Filed Vitals:   09/24/13 1230  BP:   Pulse:   Temp:   Resp: 19    Post vital signs: Reviewed  Level of consciousness: sedated  Complications: No apparent anesthesia complications

## 2013-09-24 NOTE — Discharge Instructions (Signed)
DISCHARGE INSTRUCTIONS: HYSTEROSCOPY / ENDOMETRIAL ABLATION The following instructions have been prepared to help you care for yourself upon your return home.  May take Ibuprofen, Advil, Motrin after 5:30pm as needed for pain  Personal hygiene:  Use sanitary pads for vaginal drainage, not tampons.  Shower the day after your procedure.  NO tub baths, pools or Jacuzzis for 2-3 weeks.  Wipe front to back after using the bathroom.  Activity and limitations:  Do NOT drive or operate any equipment for 24 hours. The effects of anesthesia are still present and drowsiness may result.  Do NOT rest in bed all day.  Walking is encouraged.  Walk up and down stairs slowly.  You may resume your normal activity in one to two days or as indicated by your physician. Sexual activity: NO intercourse for at least 2 weeks after the procedure, or as indicated by your Doctor.  Diet: Eat a light meal as desired this evening. You may resume your usual diet tomorrow.  Return to Work: You may resume your work activities in one to two days or as indicated by Marine scientist.  What to expect after your surgery: Expect to have vaginal bleeding/discharge for 2-3 days and spotting for up to 10 days. It is not unusual to have soreness for up to 1-2 weeks. You may have a slight burning sensation when you urinate for the first day. Mild cramps may continue for a couple of days. You may have a regular period in 2-6 weeks.  Call your doctor for any of the following:  Excessive vaginal bleeding or clotting, saturating and changing one pad every hour.  Inability to urinate 6 hours after discharge from hospital.  Pain not relieved by pain medication.  Fever of 100.4 F or greater.  Unusual vaginal discharge or odor.   [facility] [phone]

## 2013-09-24 NOTE — H&P (View-Only) (Signed)
Chelsea Mckay is a 53 y.o. female P 3-0-0-3 for hysteroscopy, dilatation, curettage and resection of endometrial polyp because of metrorrhagia.  The patient reports monthly periods until 2007 when she began skipping her periods  up to 2 months at a time.  This past year, prior to February 2015 she has skipped 6 months then began to bleed for 10 days.  There was spotting and heavier bleeding requiring a pad change every 2-3 hours (with clots).  She denies any significant cramping.  She goes on to say that she hasn't had any changes in bowel or bladder function and no dyspareunia.  A TSH, Prolactin and CBC done in February 2015 were normal as was an endometrial biopsy showing atrophic endometrium.  A sono-hysterogram done at that same time showed an endometrium = 2.18 mm with 0.7 cm x 0.40 cm endometrial mass with saline infusion- no flow is seen on color Doppler though polyp couldn't be ruled out.   Both ovaries appeared normal on that study and  endometrial cavity width = 2.8cm/ length  = 4.0cm respectively.  A review of both medical and surgical management options were given to the patient, to include observation, however, she has decided to proceed with surgical resection of her endometrial mass.   Past Medical History  OB History: G: 3; P: 3-0-0-3; . SVD: 1980, 1981 and 1987  GYN History: menarche: 53 YO    LMP: 08/03/2013    Contracepton bilateral tubal ligation  The patient denies history of sexually transmitted disease.  Remote history of abnormal PAP smear in 1988 treated with cryotherapy.   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 Adhesive [Tape] Rash  Lactose Intolerant   Denies sensitivity to peanuts, shellfish, soy, or  latex   ROS: Admits to reading  glasses, partial upper dental plate, occasional headaches with weather changes, occasional chest pain with anxiety, right knee swelling and rash in groin;  Denies, vision changes, nasal congestion, dysphagia, tinnitus, dizziness, hoarseness, cough, shortness of breath, nausea, vomiting, diarrhea,constipation,  urinary frequency, urgency  dysuria, hematuria, vaginitis symptoms, pelvic pain,easy bruising,  myalgias, skin rashes, unexplained weight loss and except as is mentioned in the history of present illness, patient's review of systems is otherwise negative.   Physical Exam  Bp: 126/70   P: 72 bpm  R: 18  Temperature: 98.4 degrees F orally Weight: 168 lbs.  Height: 5'5" BMI: 28  Neck: supple without masses or thyromegaly Lungs: clear to auscultation Heart: regular rate and rhythm Abdomen: soft, non-tender and no organomegaly Pelvic:EGBUS- wnl; vagina-normal rugae; uterus-normal size, cervix without lesions or motion tenderness; adnexae-no tenderness or masses Extremities:  no clubbing, cyanosis or edema   Assesment: Menometrorrhagia           Endometrial Masses   Disposition:  A discussion was held with patient regarding the indication  for her procedure(s) along with the risks, which include but are not limited to: reaction to anesthesia, damage to adjacent organs, infection and excessive bleeding. The patient verbalized understanding of these risks and has consented to proceed with Hysteroscopy, Dilatation,  Curettage and Resection of Endometrial Polyp at [facility] [facility] on September 24, 2012.   CSN# [id number]   Detrick Dani J. Florene Glen, PA-C  for Dr. Franklyn Lor. Dillard

## 2013-09-24 NOTE — Anesthesia Preprocedure Evaluation (Signed)
Anesthesia Evaluation  Patient identified by MRN, date of birth, ID band Patient awake    Reviewed: Allergy & Precautions, H&P , NPO status , Patient's Chart, lab work & pertinent test results, reviewed documented beta blocker date and time   Airway Mallampati: I TM Distance: >3 FB Neck ROM: full    Dental no notable dental hx. (+) Teeth Intact   Pulmonary    Pulmonary exam normal       Cardiovascular negative cardio ROS      Neuro/Psych    GI/Hepatic negative GI ROS, Neg liver ROS,   Endo/Other  negative endocrine ROS  Renal/GU      Musculoskeletal   Abdominal Normal abdominal exam  (+)   Peds  Hematology negative hematology ROS (+)   Anesthesia Other Findings   Reproductive/Obstetrics negative OB ROS                           Anesthesia Physical Anesthesia Plan  ASA: II  Anesthesia Plan: General   Post-op Pain Management:    Induction: Intravenous  Airway Management Planned: LMA  Additional Equipment:   Intra-op Plan:   Post-operative Plan:   Informed Consent: I have reviewed the patients History and Physical, chart, labs and discussed the procedure including the risks, benefits and alternatives for the proposed anesthesia with the patient or authorized representative who has indicated his/her understanding and acceptance.     Plan Discussed with: CRNA and Surgeon  Anesthesia Plan Comments:         Anesthesia Quick Evaluation

## 2013-09-24 NOTE — Interval H&P Note (Signed)
History and Physical Interval Note:  09/24/2013 10:36 AM  Chelsea Mckay  has presented today for surgery, with the diagnosis of Metrorrhagia  The various methods of treatment have been discussed with the patient and family. After consideration of risks, benefits and other options for treatment, the patient has consented to  Procedure(s): DILATATION & CURETTAGE/HYSTEROSCOPY WITH TRUCLEAR and Endometrial Ablation (N/A) as a surgical intervention .  The patient's history has been reviewed, patient examined, no change in status, stable for surgery.  I have reviewed the patient's chart and labs.  Questions were answered to the patient's satisfaction.     [facility]

## 2013-09-24 NOTE — Transfer of Care (Signed)
Immediate Anesthesia Transfer of Care Note  Patient: Chelsea Mckay  Procedure(s) Performed: Procedure(s): DILATATION & CURETTAGE/HYSTEROSCOPY WITH TRUCLEAR and Endometrial Ablation (N/A)  Patient Location: PACU  Anesthesia Type:General  Level of Consciousness: awake, alert , oriented and patient cooperative  Airway & Oxygen Therapy: Patient Spontanous Breathing and Patient connected to nasal cannula oxygen  Post-op Assessment: Report given to PACU RN and Post -op Vital signs reviewed and stable  Post vital signs: Reviewed and stable  Complications: No apparent anesthesia complications

## 2013-09-27 ENCOUNTER — Encounter (HOSPITAL_COMMUNITY): Payer: Self-pay | Admitting: Obstetrics and Gynecology

## 2013-11-16 ENCOUNTER — Other Ambulatory Visit: Payer: Self-pay

## 2013-11-16 DIAGNOSIS — Z1231 Encounter for screening mammogram for malignant neoplasm of breast: Secondary | ICD-10-CM

## 2014-01-03 ENCOUNTER — Ambulatory Visit
Admission: RE | Admit: 2014-01-03 | Discharge: 2014-01-03 | Disposition: A | Discharge status: Home / Self Care | Payer: BC Managed Care – PPO | Source: Ambulatory Visit

## 2014-01-03 DIAGNOSIS — Z1231 Encounter for screening mammogram for malignant neoplasm of breast: Secondary | ICD-10-CM

## 2014-01-04 ENCOUNTER — Ambulatory Visit (INDEPENDENT_AMBULATORY_CARE_PROVIDER_SITE_OTHER): Payer: BC Managed Care – PPO | Admitting: Family Medicine

## 2014-01-04 VITALS — BP 122/80 | HR 70 | Temp 98.1°F | Resp 18 | Ht 65.0 in | Wt 172.0 lb

## 2014-01-04 DIAGNOSIS — Z130 Encounter for screening for diseases of the blood and blood-forming organs and certain disorders involving the immune mechanism: Secondary | ICD-10-CM

## 2014-01-04 DIAGNOSIS — Z131 Encounter for screening for diabetes mellitus: Secondary | ICD-10-CM

## 2014-01-04 DIAGNOSIS — Z13220 Encounter for screening for lipoid disorders: Secondary | ICD-10-CM

## 2014-01-04 DIAGNOSIS — R05 Cough: Secondary | ICD-10-CM

## 2014-01-04 DIAGNOSIS — B351 Tinea unguium: Secondary | ICD-10-CM

## 2014-01-04 DIAGNOSIS — Z1329 Encounter for screening for other suspected endocrine disorder: Secondary | ICD-10-CM

## 2014-01-04 DIAGNOSIS — R059 Cough, unspecified: Secondary | ICD-10-CM

## 2014-01-04 LAB — LIPID PANEL
CHOL/HDL RATIO: 4 ratio
Cholesterol: 209 mg/dL — ABNORMAL HIGH (ref 0–200)
HDL: 52 mg/dL (ref >39)
LDL Cholesterol: 135 mg/dL — ABNORMAL HIGH (ref 0–99)
Triglycerides: 112 mg/dL (ref <150)
VLDL: 22 mg/dL (ref 0–40)

## 2014-01-04 LAB — CBC
HCT: 40.4 % (ref 36.0–46.0)
HEMOGLOBIN: 14.1 g/dL (ref 12.0–15.0)
MCH: 31.1 pg (ref 26.0–34.0)
MCHC: 34.9 g/dL (ref 30.0–36.0)
MCV: 89 fL (ref 78.0–100.0)
MPV: 10.5 fL (ref 9.4–12.4)
Platelets: 257 10*3/uL (ref 150–400)
RBC: 4.54 MIL/uL (ref 3.87–5.11)
RDW: 13 % (ref 11.5–15.5)
WBC: 4.2 10*3/uL (ref 4.0–10.5)

## 2014-01-04 LAB — COMPREHENSIVE METABOLIC PANEL
ALT: 11 U/L (ref 0–35)
AST: 15 U/L (ref 0–37)
Albumin: 4.4 g/dL (ref 3.5–5.2)
Alkaline Phosphatase: 71 U/L (ref 39–117)
BUN: 12 mg/dL (ref 6–23)
CALCIUM: 9.5 mg/dL (ref 8.4–10.5)
CHLORIDE: 106 meq/L (ref 96–112)
CO2: 25 meq/L (ref 19–32)
Creat: 0.9 mg/dL (ref 0.50–1.10)
Glucose, Bld: 122 mg/dL — ABNORMAL HIGH (ref 70–99)
POTASSIUM: 4 meq/L (ref 3.5–5.3)
SODIUM: 140 meq/L (ref 135–145)
TOTAL PROTEIN: 7.1 g/dL (ref 6.0–8.3)
Total Bilirubin: 1.2 mg/dL (ref 0.2–1.2)

## 2014-01-04 LAB — HEMOGLOBIN A1C
Hgb A1c MFr Bld: 6.5 % — ABNORMAL HIGH (ref <5.7)
MEAN PLASMA GLUCOSE: 140 mg/dL — AB (ref <117)

## 2014-01-04 LAB — TSH: TSH: 1.193 u[IU]/mL (ref 0.350–4.500)

## 2014-01-04 MED ORDER — BENZONATATE 100 MG PO CAPS: 100.0000 mg | ORAL_CAPSULE | Freq: Two times a day (BID) | ORAL | Status: DC | PRN

## 2014-01-04 NOTE — Progress Notes (Signed)
[facility] [address], [place] [address] [phone]  Date:  01/04/2014   Name:  Chelsea Mckay   DOB:  11-12-1960   MRN:  [id number]  PCP:  Kennon Portela, MD    Chief Complaint: Labs Only; Cough; Nail Problem; and Cyst   History of Present Illness:  Chelsea Mckay is a 53 y.o. very pleasant female patient who presents with the following:  She needs some labs today.  She would like an A1c and a cholesterol check today, CBC and CMP. Fasting today  Also, she has noted some issues with a toenail- she injured her left great toenail last year, it grew in thick and white. It does not hurt.  She left pinky toenail is also affected.  She does not really want to treat it if not necessary  Right before thanksgiving she noted onset of a cough.  She was treated elsewhere with a shot of steroids and IM abx, and started on cipro po.  She then developed vomiting and a rash- this was thought to be due to cipro. This was stopped and she thhe then took a zpack- she finished the zpack about 3 weeks ago now. She is still coughing and can cough up some mucus.  No fever.  She does not feel bad- no fever or chills. "I'm just tired of coughing." She has noted some runny nose.   She has tried some delsym OTC- last dose last night. It does not seem to help much No wheezing.  Never a smoker She has also noted a puffiness behind the right knee and was told it might be a baker's cyst.  It does not otherwise bother her Patient Active Problem List   Diagnosis Date Noted  . Breast mass in female 12/17/2011  . AR (allergic rhinitis) 11/28/2011  . Benign hematuria 11/28/2011  . Hyperglycemia 11/28/2011    Past Medical History  Diagnosis Date  . Allergy   . Chest pressure   . Depression   . Asthma   . Asthmatic bronchitis   . Kidney stones   . Headache(784.0)     Barometric pressure migraine headaches    Past Surgical History  Procedure Laterality Date  . Tubal  ligation  1987  . Bunionectomy  1999  . Cervical polypectomy  2007  . Colonoscopy    . Colonoscopy w/ polypectomy    . Dilatation & curettage/hysteroscopy with trueclear N/A 09/24/2013    Procedure: DILATATION & CURETTAGE/HYSTEROSCOPY WITH TRUCLEAR and Endometrial Ablation;  Surgeon: Betsy Coder, MD;  Location: [facility];  Service: Gynecology;  Laterality: N/A;    History  Substance Use Topics  . Smoking status: Never Smoker   . Smokeless tobacco: Never Used  . Alcohol Use: Yes     Comment: rare    Family History  Problem Relation Age of Onset  . Diabetes Mother   . Alzheimer's disease Mother   . Cancer Sister   . Diabetes Brother   . Heart disease Father   . Cancer Sister 90    breast ca  . Diabetes Sister   . Hypertension Sister   . Cancer Sister 30    breast ca  . Diabetes Brother   . Cancer Brother 10    prostate  . Cancer Brother 20    prostate ca    Allergies  Allergen Reactions  . Adhesive [Tape] Rash  . Ciprofloxacin Nausea And Vomiting and Rash    Medication list has  been reviewed and updated.  Current Outpatient Prescriptions on File Prior to Visit  Medication Sig Dispense Refill  . fexofenadine (ALLEGRA) 180 MG tablet Take 180 mg by mouth daily as needed for allergies.    . clotrimazole-betamethasone (LOTRISONE) cream Apply 1 application topically 2 (two) times daily. Apply to affected area for 2 weeks    . HYDROcodone-acetaminophen (NORCO/VICODIN) 5-325 MG per tablet Take 1 tablet by mouth every 6 (six) hours as needed. (Patient not taking: Reported on 01/04/2014) 30 tablet 0  . ibuprofen (ADVIL,MOTRIN) 600 MG tablet Take 1 tablet (600 mg total) by mouth every 6 (six) hours as needed. (Patient not taking: Reported on 01/04/2014) 30 tablet 0  . Pseudoephedrine HCl (SINUS & ALLERGY 12 HOUR PO) Take 1 tablet by mouth as needed (for allergy headache).     No current facility-administered medications on file prior to visit.    Review of Systems:  As  per HPI- otherwise negative.   Physical Examination: Filed Vitals:   01/04/14 0914  BP: 122/80  Pulse: 70  Temp: 98.1 F (36.7 C)  Resp: 18   Filed Vitals:   01/04/14 0914  Height: 5\' 5"  (1.651 m)  Weight: 172 lb (78.019 kg)   Body mass index is 28.62 kg/(m^2). Ideal Body Weight: Weight in (lb) to have BMI = 25: 149.9  GEN: WDWN, NAD, Non-toxic, A & O x 3, overweight, looks well   HEENT: Atraumatic, Normocephalic. Neck supple. No masses, No LAD.  Bilateral TM wnl, oropharynx normal.  PEERL,EOMI.   Ears and Nose: No external deformity. CV: RRR, No M/G/R. No JVD. No thrill. No extra heart sounds. PULM: CTA B, no wheezes, crackles, rhonchi. No retractions. No resp. distress. No accessory muscle use. EXTR: No c/c/e NEURO Normal gait.  PSYCH: Normally interactive. Conversant. Not depressed or anxious appearing.  Calm demeanor.  Left foot with fungal infection of great and 5th toes Likely baker's cyst behind right knee- there is puffiness but no tendeness   Assessment and Plan: Screening for hyperlipidemia - Plan: Lipid panel  Screening for hypothyroidism - Plan: TSH  Screening, anemia, deficiency, iron - Plan: CBC  Screening for diabetes mellitus - Plan: Comprehensive metabolic panel, Hemoglobin A1c  Onychomycosis of toenail  Cough - Plan: benzonatate (TESSALON) 100 MG capsule  Await labs.  For the time being she wishes to just observe her toenail and possible bake's cyst issues Will plan further follow- up pending labs. Tessalon as needed for cough, she will let me know if not resolved in the next few weeks- Sooner if worse.     Signed Lamar Blinks, MD

## 2014-01-04 NOTE — Patient Instructions (Signed)
I will be in touch with your labs asap.   Use the tessalon perles as needed for cough- let me know if not better in the next few weeks I agree that you have a toenail fungus- if this does not bother you there is no necessity to take any medication for this I agree that you likely have a baker's cyst behind the right knee.  If you would like an ultrasound to confirm this let me know!

## 2014-12-01 ENCOUNTER — Other Ambulatory Visit: Payer: Self-pay

## 2014-12-01 DIAGNOSIS — Z1231 Encounter for screening mammogram for malignant neoplasm of breast: Secondary | ICD-10-CM

## 2014-12-28 ENCOUNTER — Encounter: Payer: Self-pay | Admitting: Family Medicine

## 2014-12-28 ENCOUNTER — Ambulatory Visit (INDEPENDENT_AMBULATORY_CARE_PROVIDER_SITE_OTHER): Payer: BLUE CROSS/BLUE SHIELD | Admitting: Family Medicine

## 2014-12-28 VITALS — BP 131/87 | HR 78 | Temp 98.8°F | Resp 16 | Ht 65.0 in | Wt 174.0 lb

## 2014-12-28 DIAGNOSIS — N898 Other specified noninflammatory disorders of vagina: Secondary | ICD-10-CM

## 2014-12-28 DIAGNOSIS — R05 Cough: Secondary | ICD-10-CM

## 2014-12-28 DIAGNOSIS — Z113 Encounter for screening for infections with a predominantly sexual mode of transmission: Secondary | ICD-10-CM

## 2014-12-28 DIAGNOSIS — R062 Wheezing: Secondary | ICD-10-CM | POA: Diagnosis not present

## 2014-12-28 DIAGNOSIS — L989 Disorder of the skin and subcutaneous tissue, unspecified: Secondary | ICD-10-CM | POA: Diagnosis not present

## 2014-12-28 DIAGNOSIS — R7309 Other abnormal glucose: Secondary | ICD-10-CM

## 2014-12-28 DIAGNOSIS — Z0000 Encounter for general adult medical examination without abnormal findings: Secondary | ICD-10-CM | POA: Diagnosis not present

## 2014-12-28 DIAGNOSIS — R059 Cough, unspecified: Secondary | ICD-10-CM

## 2014-12-28 DIAGNOSIS — Z124 Encounter for screening for malignant neoplasm of cervix: Secondary | ICD-10-CM

## 2014-12-28 DIAGNOSIS — R739 Hyperglycemia, unspecified: Secondary | ICD-10-CM

## 2014-12-28 LAB — CBC
HCT: 42.1 % (ref 36.0–46.0)
Hemoglobin: 14.4 g/dL (ref 12.0–15.0)
MCH: 31.2 pg (ref 26.0–34.0)
MCHC: 34.2 g/dL (ref 30.0–36.0)
MCV: 91.1 fL (ref 78.0–100.0)
MPV: 10.3 fL (ref 8.6–12.4)
PLATELETS: 266 10*3/uL (ref 150–400)
RBC: 4.62 MIL/uL (ref 3.87–5.11)
RDW: 13.6 % (ref 11.5–15.5)
WBC: 4.2 10*3/uL (ref 4.0–10.5)

## 2014-12-28 LAB — POCT WET + KOH PREP
Trich by wet prep: ABSENT
Yeast by KOH: ABSENT
Yeast by wet prep: ABSENT

## 2014-12-28 LAB — COMPREHENSIVE METABOLIC PANEL
ALK PHOS: 79 U/L (ref 33–130)
ALT: 12 U/L (ref 6–29)
AST: 13 U/L (ref 10–35)
Albumin: 4.4 g/dL (ref 3.6–5.1)
BILIRUBIN TOTAL: 0.8 mg/dL (ref 0.2–1.2)
BUN: 12 mg/dL (ref 7–25)
CO2: 25 mmol/L (ref 20–31)
CREATININE: 0.89 mg/dL (ref 0.50–1.05)
Calcium: 9.3 mg/dL (ref 8.6–10.4)
Chloride: 104 mmol/L (ref 98–110)
GLUCOSE: 135 mg/dL — AB (ref 65–99)
Potassium: 3.8 mmol/L (ref 3.5–5.3)
Sodium: 138 mmol/L (ref 135–146)
Total Protein: 7.3 g/dL (ref 6.1–8.1)

## 2014-12-28 LAB — LIPID PANEL
CHOL/HDL RATIO: 3 ratio (ref <=5.0)
Cholesterol: 192 mg/dL (ref 125–200)
HDL: 63 mg/dL (ref >=46)
LDL CALC: 112 mg/dL (ref <130)
Triglycerides: 85 mg/dL (ref <150)
VLDL: 17 mg/dL (ref <30)

## 2014-12-28 LAB — TSH: TSH: 1.104 u[IU]/mL (ref 0.350–4.500)

## 2014-12-28 LAB — HIV ANTIBODY (ROUTINE TESTING W REFLEX): HIV: NONREACTIVE

## 2014-12-28 LAB — HEPATITIS C ANTIBODY: HCV Ab: NEGATIVE

## 2014-12-28 MED ORDER — ALBUTEROL SULFATE HFA 108 (90 BASE) MCG/ACT IN AERS: 2.0000 | INHALATION_SPRAY | RESPIRATORY_TRACT | Status: DC | PRN

## 2014-12-28 NOTE — Patient Instructions (Addendum)
Try children's zyrtec, can use afrin for up to 3-4 days for nasal congestion For 2017, try to increase your exercise and try gentle yoga     Why follow it? Research shows. . Those who follow the Mediterranean diet have a reduced risk of heart disease  . The diet is associated with a reduced incidence of Parkinson's and Alzheimer's diseases . People following the diet may have longer life expectancies and lower rates of chronic diseases  . The Dietary Guidelines for Americans recommends the Mediterranean diet as an eating plan to promote health and prevent disease  What Is the Mediterranean Diet?  . Healthy eating plan based on typical foods and recipes of Mediterranean-style cooking . The diet is primarily a plant based diet; these foods should make up a majority of meals   Starches - Plant based foods should make up a majority of meals - They are an important sources of vitamins, minerals, energy, antioxidants, and fiber - Choose whole grains, foods high in fiber and minimally processed items  - Typical grain sources include wheat, oats, barley, corn, brown rice, bulgar, farro, millet, polenta, couscous  - Various types of beans include chickpeas, lentils, fava beans, black beans, white beans   Fruits  Veggies - Large quantities of antioxidant rich fruits & veggies; 6 or more servings  - Vegetables can be eaten raw or lightly drizzled with oil and cooked  - Vegetables common to the traditional Mediterranean Diet include: artichokes, arugula, beets, broccoli, brussel sprouts, cabbage, carrots, celery, collard greens, cucumbers, eggplant, kale, leeks, lemons, lettuce, mushrooms, okra, onions, peas, peppers, potatoes, pumpkin, radishes, rutabaga, shallots, spinach, sweet potatoes, turnips, zucchini - Fruits common to the Mediterranean Diet include: apples, apricots, avocados, cherries, clementines, dates, figs, grapefruits, grapes, melons, nectarines, oranges, peaches, pears, pomegranates,  strawberries, tangerines  Fats - Replace butter and margarine with healthy oils, such as olive oil, canola oil, and tahini  - Limit nuts to no more than a handful a day  - Nuts include walnuts, almonds, pecans, pistachios, pine nuts  - Limit or avoid candied, honey roasted or heavily salted nuts - Olives are central to the [employer] - can be eaten whole or used in a variety of dishes   Meats Protein - Limiting red meat: no more than a few times a month - When eating red meat: choose lean cuts and keep the portion to the size of deck of cards - Eggs: approx. 0 to 4 times a week  - Fish and lean poultry: at least 2 a week  - Healthy protein sources include, chicken, [place], lean beef, lamb - Increase intake of seafood such as tuna, salmon, trout, mackerel, shrimp, scallops - Avoid or limit high fat processed meats such as sausage and bacon  Dairy - Include moderate amounts of low fat dairy products  - Focus on healthy dairy such as fat free yogurt, skim milk, low or reduced fat cheese - Limit dairy products higher in fat such as whole or 2% milk, cheese, ice cream  Alcohol - Moderate amounts of red wine is ok  - No more than 5 oz daily for women (all ages) and men older than age 78  - No more than 10 oz of wine daily for men younger than 55  Other - Limit sweets and other desserts  - Use herbs and spices instead of salt to flavor foods  - Herbs and spices common to the traditional Mediterranean Diet include: basil, bay leaves, chives, cloves, cumin, fennel,  garlic, lavender, marjoram, mint, oregano, parsley, pepper, rosemary, sage, savory, sumac, tarragon, thyme   It's not just a diet, it's a lifestyle:  . The Mediterranean diet includes lifestyle factors typical of those in the region  . Foods, drinks and meals are best eaten with others and savored . Daily physical activity is important for overall good health . This could be strenuous exercise like running and aerobics . This  could also be more leisurely activities such as walking, housework, yard-work, or taking the stairs . Moderation is the key; a balanced and healthy diet accommodates most foods and drinks . Consider portion sizes and frequency of consumption of certain foods   Meal Ideas & Options:  . Breakfast:  o Whole wheat toast or whole wheat English muffins with peanut butter & hard boiled egg o Steel cut oats topped with apples & cinnamon and skim milk  o Fresh fruit: banana, strawberries, melon, berries, peaches  o Smoothies: strawberries, bananas, greek yogurt, peanut butter o Low fat greek yogurt with blueberries and granola  o Egg white omelet with spinach and mushrooms o Breakfast couscous: whole wheat couscous, apricots, skim milk, cranberries  . Sandwiches:  o Hummus and grilled vegetables (peppers, zucchini, squash) on whole wheat bread   o Grilled chicken on whole wheat pita with lettuce, tomatoes, cucumbers or tzatziki  o Tuna salad on whole wheat bread: tuna salad made with greek yogurt, olives, red peppers, capers, green onions o Garlic rosemary lamb pita: lamb sauted with garlic, rosemary, salt & pepper; add lettuce, cucumber, greek yogurt to pita - flavor with lemon juice and black pepper  . Seafood:  o Mediterranean grilled salmon, seasoned with garlic, basil, parsley, lemon juice and black pepper o Shrimp, lemon, and spinach whole-grain pasta salad made with low fat greek yogurt  o Seared scallops with lemon orzo  o Seared tuna steaks seasoned salt, pepper, coriander topped with tomato mixture of olives, tomatoes, olive oil, minced garlic, parsley, green onions and cappers  . Meats:  o Herbed greek chicken salad with kalamata olives, cucumber, feta  o Red bell peppers stuffed with spinach, bulgur, lean ground beef (or lentils) & topped with feta   o Kebabs: skewers of chicken, tomatoes, onions, zucchini, squash  o [place] burgers: made with red onions, mint, dill, lemon juice, feta  cheese topped with roasted red peppers . Vegetarian o Cucumber salad: cucumbers, artichoke hearts, celery, red onion, feta cheese, tossed in olive oil & lemon juice  o Hummus and whole grain pita points with a greek salad (lettuce, tomato, feta, olives, cucumbers, red onion) o Lentil soup with celery, carrots made with vegetable broth, garlic, salt and pepper  o Tabouli salad: parsley, bulgur, mint, scallions, cucumbers, tomato, radishes, lemon juice, olive oil, salt and pepper.     Perimenopause Perimenopause is the time when your body begins to move into the menopause (no menstrual period for 12 straight months). It is a natural process. Perimenopause can begin 2-8 years before the menopause and usually lasts for 1 year after the menopause. During this time, your ovaries may or may not produce an egg. The ovaries vary in their production of estrogen and progesterone hormones each month. This can cause irregular menstrual periods, difficulty getting pregnant, vaginal bleeding between periods, and uncomfortable symptoms. CAUSES  Irregular production of the ovarian hormones, estrogen and progesterone, and not ovulating every month.  Other causes include:  Tumor of the pituitary gland in the brain.  Medical disease that affects the ovaries.  Radiation treatment.  Chemotherapy.  Unknown causes.  Heavy smoking and excessive alcohol intake can bring on perimenopause sooner. SIGNS AND SYMPTOMS   Hot flashes.  Night sweats.  Irregular menstrual periods.  Decreased sex drive.  Vaginal dryness.  Headaches.  Mood swings.  Depression.  Memory problems.  Irritability.  Tiredness.  Weight gain.  Trouble getting pregnant.  The beginning of losing bone cells (osteoporosis).  The beginning of hardening of the arteries (atherosclerosis). DIAGNOSIS  Your health care provider will make a diagnosis by analyzing your age, menstrual history, and symptoms. He or she will do a  physical exam and note any changes in your body, especially your female organs. Female hormone tests may or may not be helpful depending on the amount of female hormones you produce and when you produce them. However, other hormone tests may be helpful to rule out other problems. TREATMENT  In some cases, no treatment is needed. The decision on whether treatment is necessary during the perimenopause should be made by you and your health care provider based on how the symptoms are affecting you and your lifestyle. Various treatments are available, such as:  Treating individual symptoms with a specific medicine for that symptom.  Herbal medicines that can help specific symptoms.  Counseling.  Group therapy. HOME CARE INSTRUCTIONS   Keep track of your menstrual periods (when they occur, how heavy they are, how long between periods, and how long they last) as well as your symptoms and when they started.  Only take over-the-counter or prescription medicines as directed by your health care provider.  Sleep and rest.  Exercise.  Eat a diet that contains calcium (good for your bones) and soy (acts like the estrogen hormone).  Do not smoke.  Avoid alcoholic beverages.  Take vitamin supplements as recommended by your health care provider. Taking vitamin E may help in certain cases.  Take calcium and vitamin D supplements to help prevent bone loss.  Group therapy is sometimes helpful.  Acupuncture may help in some cases. SEEK MEDICAL CARE IF:   You have questions about any symptoms you are having.  You need a referral to a specialist (gynecologist, psychiatrist, or psychologist). SEEK IMMEDIATE MEDICAL CARE IF:   You have vaginal bleeding.  Your period lasts longer than 8 days.  Your periods are recurring sooner than 21 days.  You have bleeding after intercourse.  You have severe depression.  You have pain when you urinate.  You have severe headaches.  You have vision  problems.   This information is not intended to replace advice given to you by your health care provider. Make sure you discuss any questions you have with your health care provider.   Document Released: 02/08/2004 Document Revised: 01/21/2014 Document Reviewed: 07/30/2012 Elsevier Interactive Patient Education [employer].

## 2014-12-28 NOTE — Progress Notes (Signed)
Subjective:    Patient ID: Chelsea Mckay, female    DOB: 1960-05-16, 54 y.o.   MRN: [id number]  HPI This is a pleasant 54 yo female who presents today for CPE. She is a widower. She has been sexually active in the last year with one partner and requests STD testing today.   Last CPE- 2015 Mammo- 01/03/2014 Pap- 12/17/11, negative  Colonoscopy- 01/02/11 Tdap- 2008 Flu- annual Pneumonia- 11/03/14 Eye- due  Dental- regular Exercise- has not been walking regularly, too busy, will be resuming soon.  She is still having monthly periods. Has noticed she awakens from sleep more easily and has difficulty going back to sleep. Feels more stressed. No vasomotor symptoms.   She has long standing history of allergic rhinitis. She has been taking 1/2 tablet of Allegra D with good relief, but finds it makes her wired.   Review of Systems  Constitutional: Negative.   HENT: Positive for sinus pressure.   Eyes: Positive for visual disturbance (has noticed more diifficulty with near vision, using readers, due eye exam. ).  Respiratory: Positive for cough (postnasal drainage., pesistent, has had inhaler in the past which helped.) and wheezing (at end of cough). Chest tightness: from cough.   Cardiovascular: Positive for chest pain (1-2 times a month, lasts a couple of minutes, relieved with burping. ).  Genitourinary: Positive for vaginal discharge (thin, white, non odorus. ).  Musculoskeletal: Positive for myalgias and arthralgias.  Skin:       She has noticed several new skin lesions on bilateral ankles over the last year, they seem to be getting bigger.   Allergic/Immunologic: Positive for environmental allergies (doesn't like nasal sprays.).  Neurological: Positive for headaches (with allergy symptoms.).  Psychiatric/Behavioral: Positive for sleep disturbance, dysphoric mood and decreased concentration.      Objective:   Physical Exam Physical Exam  Constitutional: She is oriented to  person, place, and time. She appears well-developed and well-nourished. No distress.  HENT:  Head: Normocephalic and atraumatic.  Right Ear: External ear normal.  Left Ear: External ear normal.  Nose: Nose normal.  Mouth/Throat: Oropharynx is clear and moist. No oropharyngeal exudate.  Eyes: Conjunctivae are normal. Pupils are equal, round, and reactive to light.  Neck: Normal range of motion. Neck supple. No JVD present. No thyromegaly present.  Cardiovascular: Normal rate, regular rhythm, normal heart sounds and intact distal pulses.   Pulmonary/Chest: Effort normal and breath sounds normal. Right breast exhibits no inverted nipple, no mass, no nipple discharge, no skin change and no tenderness. Left breast exhibits no inverted nipple, no mass, no nipple discharge, no skin change and no tenderness. Breasts are symmetrical.  Abdominal: Soft. Bowel sounds are normal. She exhibits no distension and no mass. There is no tenderness. There is no rebound and no guarding.  Genitourinary: Vagina normal. Pelvic exam was performed with patient supine. There is no rash, tenderness, lesion or injury on the right labia. There is no rash, tenderness, lesion or injury on the left labia. Cervix exhibits no motion tenderness. Thin white discharge.   Musculoskeletal: Normal range of motion. She exhibits no edema or tenderness.  Lymphadenopathy:    She has no cervical adenopathy.  Neurological: She is alert and oriented to person, place, and time. She has normal reflexes.  Skin: Skin is warm and dry. She is not diaphoretic. She has 1-2 raised areas on each of her ankles, approx. 5 mm, no erythema, firm.  Psychiatric: She has a normal mood and affect. Her behavior  is normal. Judgment and thought content normal.  Vitals reviewed.  BP 131/87 mmHg  Pulse 78  Temp(Src) 98.8 F (37.1 C)  Resp 16  Ht 5\' 5"  (1.651 m)  Wt 174 lb (78.926 kg)  BMI 28.96 kg/m2 Wt Readings from Last 3 Encounters:  12/28/14 174 lb  (78.926 kg)  01/04/14 172 lb (78.019 kg)  09/15/13 169 lb (76.658 kg)   Depression screen PHQ 2/9 12/28/2014  Decreased Interest 1  Down, Depressed, Hopeless 1  PHQ - 2 Score 2  Altered sleeping 1  Tired, decreased energy 1  Change in appetite 1  Feeling bad or failure about yourself  0  Trouble concentrating 1  Moving slowly or fidgety/restless 0  Suicidal thoughts 0  PHQ-9 Score 6  Difficult doing work/chores Somewhat difficult      Assessment & Plan:  1. Annual physical exam - anticipatory guidance regarding perimenopause provided verbal and written - encouraged self care measures- healthy food choices, regular exercise, regular sleep, suggested yoga/meditation.  2. Vaginal discharge - POCT Wet + KOH Prep - wet prep was negative for BV, yeast. Discussed adding probiotic, baths with baking soda.   3. Screening for STD (sexually transmitted disease) - HIV antibody - Hepatitis C antibody - RPR  4. Cough - patient reports this is improving, she does have a history of asthma. If cough persists, will check PFTs and consider inhaled corticosteroid.  - albuterol (PROVENTIL HFA;VENTOLIN HFA) 108 (90 BASE) MCG/ACT inhaler; Inhale 2 puffs into the lungs every 4 (four) hours as needed for wheezing or shortness of breath (cough, shortness of breath or wheezing.).  Dispense: 1 Inhaler; Refill: 1  5. Wheeze - albuterol (PROVENTIL HFA;VENTOLIN HFA) 108 (90 BASE) MCG/ACT inhaler; Inhale 2 puffs into the lungs every 4 (four) hours as needed for wheezing or shortness of breath (cough, shortness of breath or wheezing.).  Dispense: 1 Inhaler; Refill: 1  6. Skin lesion - Amb ref to dermatology  7. Screening for cervical cancer - Pap IG, CT/NG w/ reflex HPV when ASC-U  8. Elevated blood sugar - CBC - TSH - Hemoglobin A1c - Lipid panel - Comprehensive metabolic panel - encouraged her to resume walking  Clarene Reamer, FNP-BC  [facility] [place]  [facility]  12/31/2014 8:57 AM

## 2014-12-29 LAB — RPR

## 2014-12-29 LAB — HEMOGLOBIN A1C
Hgb A1c MFr Bld: 6.9 % — ABNORMAL HIGH (ref <5.7)
Mean Plasma Glucose: 151 mg/dL — ABNORMAL HIGH (ref <117)

## 2014-12-30 LAB — PAP IG, CT-NG, RFX HPV ASCU
Chlamydia Probe Amp: NOT DETECTED
GC Probe Amp: NOT DETECTED

## 2015-01-05 ENCOUNTER — Ambulatory Visit
Admission: RE | Admit: 2015-01-05 | Discharge: 2015-01-05 | Disposition: A | Discharge status: Home / Self Care | Payer: BLUE CROSS/BLUE SHIELD | Source: Ambulatory Visit

## 2015-01-05 DIAGNOSIS — Z1231 Encounter for screening mammogram for malignant neoplasm of breast: Secondary | ICD-10-CM

## 2015-04-25 ENCOUNTER — Encounter: Payer: Self-pay | Admitting: Family Medicine

## 2015-04-25 ENCOUNTER — Ambulatory Visit (INDEPENDENT_AMBULATORY_CARE_PROVIDER_SITE_OTHER): Payer: BLUE CROSS/BLUE SHIELD | Admitting: Family Medicine

## 2015-04-25 VITALS — BP 122/79 | HR 71 | Temp 98.1°F | Resp 16 | Ht 65.0 in | Wt 172.0 lb

## 2015-04-25 DIAGNOSIS — R7309 Other abnormal glucose: Secondary | ICD-10-CM

## 2015-04-25 DIAGNOSIS — M25531 Pain in right wrist: Secondary | ICD-10-CM | POA: Diagnosis not present

## 2015-04-25 DIAGNOSIS — B351 Tinea unguium: Secondary | ICD-10-CM | POA: Diagnosis not present

## 2015-04-25 DIAGNOSIS — N951 Menopausal and female climacteric states: Secondary | ICD-10-CM | POA: Diagnosis not present

## 2015-04-25 DIAGNOSIS — R739 Hyperglycemia, unspecified: Secondary | ICD-10-CM

## 2015-04-25 DIAGNOSIS — M7121 Synovial cyst of popliteal space [Baker], right knee: Secondary | ICD-10-CM | POA: Diagnosis not present

## 2015-04-25 NOTE — Patient Instructions (Addendum)
Please take Alleve 2 tablets twice a day for 7-10 days Wear your brace when lifting boxes   IF you received an x-ray today, you will receive an invoice from [facility]. Please contact [facility] at [phone] with questions or concerns regarding your invoice.   IF you received labwork today, you will receive an invoice from [employer]. Please contact [facility] at [phone] with questions or concerns regarding your invoice.   Our billing staff will not be able to assist you with questions regarding bills from these companies.  You will be contacted with the lab results as soon as they are available. The fastest way to get your results is to activate your My Chart account. Instructions are located on the last page of this paperwork. If you have not heard from [place] regarding the results in 2 weeks, please contact this office.     Baker Cyst A Baker cyst is a sac-like structure that forms in the back of the knee. It is filled with the same fluid that is located in your knee. This fluid lubricates the bones and cartilage of the knee and allows them to move over each other more easily. CAUSES  When the knee becomes injured or inflamed, increased fluid forms in the knee. When this happens, the joint lining is pushed out behind the knee and forms the Baker cyst. This cyst may also be caused by inflammation from arthritic conditions and infections. SIGNS AND SYMPTOMS  A Baker cyst usually has no symptoms. When the cyst is substantially enlarged:  You may feel pressure behind the knee, stiffness in the knee, or a mass in the area behind the knee.  You may develop pain, redness, and swelling in the calf. This can suggest a blood clot and requires evaluation by your health care provider. DIAGNOSIS  A Baker cyst is most often found during an ultrasound exam. This exam may have been performed for other reasons, and the cyst was found incidentally.  Sometimes an MRI is used. This picks up other problems within a joint that an ultrasound exam may not. If the Baker cyst developed immediately after an injury, X-ray exams may be used to diagnose the cyst. TREATMENT  The treatment depends on the cause of the cyst. Anti-inflammatory medicines and rest often will be prescribed. If the cyst is caused by a bacterial infection, antibiotic medicines may be prescribed.  HOME CARE INSTRUCTIONS   If the cyst was caused by an injury, for the first 24 hours, keep the injured leg elevated on 2 pillows while lying down.  For the first 24 hours while you are awake, apply ice to the injured area:  Put ice in a plastic bag.  Place a towel between your skin and the bag.  Leave the ice on for 20 minutes, 2-3 times a day.  Only take over-the-counter or prescription medicines for pain, discomfort, or fever as directed by your health care provider.  Only take antibiotic medicine as directed. Make sure to finish it even if you start to feel better. MAKE SURE YOU:   Understand these instructions.  Will watch your condition.  Will get help right away if you are not doing well or get worse.   This information is not intended to replace advice given to you by your health care provider. Make sure you discuss any questions you have with your health care provider.   Document Released: 12/31/2004 Document Revised: 10/21/2012 Document Reviewed: 08/12/2012 Elsevier Interactive Patient Education 2016 Elsevier  Inc.  

## 2015-04-25 NOTE — Progress Notes (Signed)
Subjective:    Patient ID: Chelsea Mckay, female    DOB: [birth year], 55 y.o.   MRN: [id number]  HPI This is a pleasant 55 yo female who presents today with several month history of left wrist pain. Pain every day. Some tingling in 2,3,4 fingers. No weakness, no radiation up arm, no erythema, some edema x 1 day. 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 She has tried a couple of doses of Alleve with good improvement and has used an OTC brace daytime/nighttime with no improvement.   She has also had pain behind her right knee x 2 in the last several months. She has been previously diagnosed with Baker's Cyst. No weakness, instability or radiating pain.    Has had problems with toenail fungus in the past, many years ago, was successfully treated with oral antifungals. Problem has returned and she has several thickened, discolored toenails.   Her periods have been irregular for about 2 years. She has gone as long as 9 months between bleeding and can have heavy, long periods. LMP last month and she felt very down. Thinks she is going to have her period again soon and is feeling more tired and depressed thinking about her deceased husband and mother. Good social support, attends church.   Past Medical History  Diagnosis Date  . Allergy   . Chest pressure   . Depression   . Asthma   . Asthmatic bronchitis   . Kidney stones   . Headache(784.0)     Barometric pressure migraine headaches   Past Surgical History  Procedure Laterality Date  . Tubal ligation  1987  . Bunionectomy  1999  . Cervical polypectomy  2007  . Colonoscopy    . Colonoscopy w/ polypectomy    . Dilatation & curettage/hysteroscopy with trueclear N/A 09/24/2013    Procedure: DILATATION & CURETTAGE/HYSTEROSCOPY WITH TRUCLEAR and Endometrial Ablation;  Surgeon: Betsy Coder, MD;  Location: [facility];  Service: Gynecology;  Laterality: N/A;   Family History  Problem Relation Age of Onset  . Diabetes Mother   .  Alzheimer's disease Mother   . Cancer Sister   . Diabetes Brother   . Heart disease Father   . Cancer Sister 19    breast ca  . Diabetes Sister   . Hypertension Sister   . Cancer Sister 49    breast ca  . Diabetes Brother   . Cancer Brother 35    prostate  . Cancer Brother 51    prostate ca   Social History  Substance Use Topics  . Smoking status: Never Smoker   . Smokeless tobacco: Never Used  . Alcohol Use: Yes     Comment: rare      Review of Systems Per HPI    Objective:   Physical Exam  Constitutional: She is oriented to person, place, and time. She appears well-developed and well-nourished.  Cardiovascular: Normal rate, regular rhythm and normal heart sounds.   Pulmonary/Chest: Effort normal and breath sounds normal.  Musculoskeletal:       Right wrist: She exhibits tenderness (with flexion). She exhibits normal range of motion, no bony tenderness and no swelling.       Right knee: She exhibits swelling (posterior patellar fullness). She exhibits normal range of motion, no effusion, no deformity, no erythema and no bony tenderness. No tenderness found.  Normal strength of wrist, fingers, no erythema or edema.     Neurological: She is alert and oriented  to person, place, and time.  Skin: Skin is warm and dry.  Psychiatric: She has a normal mood and affect. Her behavior is normal. Judgment and thought content normal.  Vitals reviewed.     BP 122/79 mmHg  Pulse 71  Temp(Src) 98.1 F (36.7 C)  Resp 16  Ht 5\' 5"  (1.651 m)  Wt 172 lb (78.019 kg)  BMI 28.62 kg/m2 Wt Readings from Last 3 Encounters:  04/25/15 172 lb (78.019 kg)  12/28/14 174 lb (78.926 kg)  01/04/14 172 lb (78.019 kg)   Depression screen [facility] 2/9 04/25/2015 12/28/2014  Decreased Interest 0 1  Down, Depressed, Hopeless 3 1  PHQ - 2 Score 3 2  Altered sleeping 3 1  Tired, decreased energy 3 1  Change in appetite 1 1  Feeling bad or failure about yourself  0 0  Trouble concentrating 2 1    Moving slowly or fidgety/restless 0 0  Suicidal thoughts 0 0  PHQ-9 Score 12 6  Difficult doing work/chores - Somewhat difficult       Assessment & Plan:  1. Elevated blood sugar - Hemoglobin A1c  2. Onychomycosis of toenail - Hepatic Function Panel - terbinafine 250 mg po qd x 12 weeks  3. Right wrist pain - good relief with 2 doses of Alleve, will try 7-10 days and use brace while lifting boxes at work, can also use ice - ortho referral if no improvement  4. Baker cyst, right - reassurance provided and written/verbal information  5. Perimenopause - patient attributes her mood swings and feeling down to irregular cycles, we discussed pharmacologic and non pharmacologic treatments/symptom relief measures. Patient is not interested in any medication at this time.  - follow up in 6 months   Clarene Reamer, FNP-BC  [facility]  04/27/2015 6:37 AM

## 2015-04-26 LAB — HEMOGLOBIN A1C
Hgb A1c MFr Bld: 6.3 % — ABNORMAL HIGH (ref <5.7)
Mean Plasma Glucose: 134 mg/dL

## 2015-04-26 LAB — HEPATIC FUNCTION PANEL
ALBUMIN: 4.2 g/dL (ref 3.6–5.1)
ALT: 10 U/L (ref 6–29)
AST: 14 U/L (ref 10–35)
Alkaline Phosphatase: 70 U/L (ref 33–130)
BILIRUBIN INDIRECT: 1 mg/dL (ref 0.2–1.2)
Bilirubin, Direct: 0.2 mg/dL (ref <=0.2)
Total Bilirubin: 1.2 mg/dL (ref 0.2–1.2)
Total Protein: 7 g/dL (ref 6.1–8.1)

## 2015-04-27 MED ORDER — TERBINAFINE HCL 250 MG PO TABS: 250.0000 mg | ORAL_TABLET | Freq: Every day | ORAL | Status: DC

## 2015-05-23 ENCOUNTER — Ambulatory Visit (INDEPENDENT_AMBULATORY_CARE_PROVIDER_SITE_OTHER): Payer: BLUE CROSS/BLUE SHIELD | Admitting: Urgent Care

## 2015-05-23 VITALS — BP 130/82 | HR 75 | Temp 98.1°F | Resp 16 | Ht 65.5 in | Wt 174.2 lb

## 2015-05-23 DIAGNOSIS — F32A Depression, unspecified: Secondary | ICD-10-CM

## 2015-05-23 DIAGNOSIS — M546 Pain in thoracic spine: Secondary | ICD-10-CM | POA: Diagnosis not present

## 2015-05-23 DIAGNOSIS — F329 Major depressive disorder, single episode, unspecified: Secondary | ICD-10-CM | POA: Diagnosis not present

## 2015-05-23 DIAGNOSIS — R457 State of emotional shock and stress, unspecified: Secondary | ICD-10-CM

## 2015-05-23 DIAGNOSIS — G4700 Insomnia, unspecified: Secondary | ICD-10-CM | POA: Diagnosis not present

## 2015-05-23 MED ORDER — MELOXICAM 7.5 MG PO TABS
7.5000 mg | ORAL_TABLET | Freq: Every day | ORAL | Status: DC
Start: 2015-05-23 — End: 2015-06-15

## 2015-05-23 MED ORDER — FLUOXETINE HCL 20 MG PO TABS: 20.0000 mg | ORAL_TABLET | Freq: Every day | ORAL | Status: DC

## 2015-05-23 MED ORDER — LORAZEPAM 0.5 MG PO TABS: 0.5000 mg | ORAL_TABLET | Freq: Every evening | ORAL | Status: DC | PRN

## 2015-05-23 MED ORDER — CYCLOBENZAPRINE HCL ER 15 MG PO CP24: 15.0000 mg | ORAL_CAPSULE | Freq: Every day | ORAL | Status: DC | PRN

## 2015-05-23 NOTE — Patient Instructions (Addendum)
[facility] [place], [place] [address]   Burnard Leigh [phone]  Horton Finer [phone]  Everardo Beals [phone]    Fluoxetine capsules or tablets (Depression/Mood Disorders) What is this medicine? FLUOXETINE (floo OX e teen) belongs to a class of drugs known as selective serotonin reuptake inhibitors (SSRIs). It helps to treat mood problems such as depression, obsessive compulsive disorder, and panic attacks. It can also treat certain eating disorders. This medicine may be used for other purposes; ask your health care provider or pharmacist if you have questions. What should I tell my health care provider before I take this medicine? They need to know if you have any of these conditions: -bipolar disorder or mania -diabetes -glaucoma -liver disease -psychosis -seizures -suicidal thoughts or history of attempted suicide -an unusual or allergic reaction to fluoxetine, other medicines, foods, dyes, or preservatives -pregnant or trying to get pregnant -breast-feeding How should I use this medicine? Take this medicine by mouth with a glass of water. Follow the directions on the prescription label. You can take this medicine with or without food. Take your medicine at regular intervals. Do not take it more often than directed. Do not stop taking this medicine suddenly except upon the advice of your doctor. Stopping this medicine too quickly may cause serious side effects or your condition may worsen. A special MedGuide will be given to you by the pharmacist with each prescription and refill. Be sure to read this information carefully each time. Talk to your pediatrician regarding the use of this medicine in children. While this drug may be prescribed for children as young as 7 years for selected conditions, precautions do apply. Overdosage: If you think you have taken too much of this medicine contact a poison control center or emergency  room at once. NOTE: This medicine is only for you. Do not share this medicine with others. What if I miss a dose? If you miss a dose, skip the missed dose and go back to your regular dosing schedule. Do not take double or extra doses. What may interact with this medicine? Do not take fluoxetine with any of the following medications: -other medicines containing fluoxetine, like Sarafem or Symbyax -cisapride -linezolid -MAOIs like Carbex, Eldepryl, Marplan, Nardil, and Parnate -methylene blue (injected into a vein) -pimozide -thioridazine This medicine may also interact with the following medications: -alcohol -aspirin and aspirin-like medicines -carbamazepine -certain medicines for depression, anxiety, or psychotic disturbances -certain medicines for migraine headaches like almotriptan, eletriptan, frovatriptan, naratriptan, rizatriptan, sumatriptan, zolmitriptan -digoxin -diuretics -fentanyl -flecainide -furazolidone -isoniazid -lithium -medicines for sleep -medicines that treat or prevent blood clots like warfarin, enoxaparin, and dalteparin -NSAIDs, medicines for pain and inflammation, like ibuprofen or naproxen -phenytoin -procarbazine -propafenone -rasagiline -ritonavir -supplements like St. John's wort, kava kava, valerian -tramadol -tryptophan -vinblastine This list may not describe all possible interactions. Give your health care provider a list of all the medicines, herbs, non-prescription drugs, or dietary supplements you use. Also tell them if you smoke, drink alcohol, or use illegal drugs. Some items may interact with your medicine. What should I watch for while using this medicine? Tell your doctor if your symptoms do not get better or if they get worse. Visit your doctor or health care professional for regular checks on your progress. Because it may take several weeks to see the full effects of this medicine, it is important to continue your treatment as  prescribed by your doctor. Patients and their families should watch out for new or worsening  thoughts of suicide or depression. Also watch out for sudden changes in feelings such as feeling anxious, agitated, panicky, irritable, hostile, aggressive, impulsive, severely restless, overly excited and hyperactive, or not being able to sleep. If this happens, especially at the beginning of treatment or after a change in dose, call your health care professional. Chelsea Mckay may get drowsy or dizzy. Do not drive, use machinery, or do anything that needs mental alertness until you know how this medicine affects you. Do not stand or sit up quickly, especially if you are an older patient. This reduces the risk of dizzy or fainting spells. Alcohol may interfere with the effect of this medicine. Avoid alcoholic drinks. Your mouth may get dry. Chewing sugarless gum or sucking hard candy, and drinking plenty of water may help. Contact your doctor if the problem does not go away or is severe. This medicine may affect blood sugar levels. If you have diabetes, check with your doctor or health care professional before you change your diet or the dose of your diabetic medicine. What side effects may I notice from receiving this medicine? Side effects that you should report to your doctor or health care professional as soon as possible: -allergic reactions like skin rash, itching or hives, swelling of the face, lips, or tongue -breathing problems -confusion -eye pain, changes in vision -fast or irregular heart rate, palpitations -flu-like fever, chills, cough, muscle or joint aches and pains -seizures -suicidal thoughts or other mood changes -swelling or redness in or around the eye -tremors -trouble sleeping -unusual bleeding or bruising -unusually tired or weak -vomiting Side effects that usually do not require medical attention (report to your doctor or health care professional if they continue or are  bothersome): -change in sex drive or performance -diarrhea -dry mouth -flushing -headache -increased or decreased appetite -nausea -sweating This list may not describe all possible side effects. Call your doctor for medical advice about side effects. You may report side effects to FDA at 1-800-FDA-1088. Where should I keep my medicine? Keep out of the reach of children. Store at room temperature between 15 and 30 degrees C (59 and 86 degrees F). Throw away any unused medicine after the expiration date. NOTE: This sheet is a summary. It may not cover all possible information. If you have questions about this medicine, talk to your doctor, pharmacist, or health care provider.    2016, Elsevier/Gold Standard. (2013-12-24 12:40:07)    Major Depressive Disorder Major depressive disorder is a mental illness. It also may be called clinical depression or unipolar depression. Major depressive disorder usually causes feelings of sadness, hopelessness, or helplessness. Some people with this disorder do not feel particularly sad but lose interest in doing things they used to enjoy (anhedonia). Major depressive disorder also can cause physical symptoms. It can interfere with work, school, relationships, and other normal everyday activities. The disorder varies in severity but is longer lasting and more serious than the sadness we all feel from time to time in our lives. Major depressive disorder often is triggered by stressful life events or major life changes. Examples of these triggers include divorce, loss of your job or home, a move, and the death of a family member or close friend. Sometimes this disorder occurs for no obvious reason at all. People who have family members with major depressive disorder or bipolar disorder are at higher risk for developing this disorder, with or without life stressors. Major depressive disorder can occur at any age. It may occur just once  in your life (single episode  major depressive disorder). It may occur multiple times (recurrent major depressive disorder). SYMPTOMS People with major depressive disorder have either anhedonia or depressed mood on nearly a daily basis for at least 2 weeks or longer. Symptoms of depressed mood include:  Feelings of sadness (blue or down in the dumps) or emptiness.  Feelings of hopelessness or helplessness.  Tearfulness or episodes of crying (may be observed by others).  Irritability (children and adolescents). In addition to depressed mood or anhedonia or both, people with this disorder have at least four of the following symptoms:  Difficulty sleeping or sleeping too much.   Significant change (increase or decrease) in appetite or weight.   Lack of energy or motivation.  Feelings of guilt and worthlessness.   Difficulty concentrating, remembering, or making decisions.  Unusually slow movement (psychomotor retardation) or restlessness (as observed by others).   Recurrent wishes for death, recurrent thoughts of self-harm (suicide), or a suicide attempt. People with major depressive disorder commonly have persistent negative thoughts about themselves, other people, and the world. People with severe major depressive disorder may experiencedistorted beliefs or perceptions about the world (psychotic delusions). They also may see or hear things that are not real (psychotic hallucinations). DIAGNOSIS Major depressive disorder is diagnosed through an assessment by your health care provider. Your health care provider will ask aboutaspects of your daily life, such as mood,sleep, and appetite, to see if you have the diagnostic symptoms of major depressive disorder. Your health care provider may ask about your medical history and use of alcohol or drugs, including prescription medicines. Your health care provider also may do a physical exam and blood work. This is because certain medical conditions and the use of certain  substances can cause major depressive disorder-like symptoms (secondary depression). Your health care provider also may refer you to a mental health specialist for further evaluation and treatment. TREATMENT It is important to recognize the symptoms of major depressive disorder and seek treatment. The following treatments can be prescribed for this disorder:   Medicine. Antidepressant medicines usually are prescribed. Antidepressant medicines are thought to correct chemical imbalances in the brain that are commonly associated with major depressive disorder. Other types of medicine may be added if the symptoms do not respond to antidepressant medicines alone or if psychotic delusions or hallucinations occur.  Talk therapy. Talk therapy can be helpful in treating major depressive disorder by providing support, education, and guidance. Certain types of talk therapy also can help with negative thinking (cognitive behavioral therapy) and with relationship issues that trigger this disorder (interpersonal therapy). A mental health specialist can help determine which treatment is best for you. Most people with major depressive disorder do well with a combination of medicine and talk therapy. Treatments involving electrical stimulation of the brain can be used in situations with extremely severe symptoms or when medicine and talk therapy do not work over time. These treatments include electroconvulsive therapy, transcranial magnetic stimulation, and vagal nerve stimulation.   This information is not intended to replace advice given to you by your health care provider. Make sure you discuss any questions you have with your health care provider.   Document Released: 04/27/2012 Document Revised: 01/21/2014 Document Reviewed: 04/27/2012 Elsevier Interactive Patient Education [employer].

## 2015-05-23 NOTE — Progress Notes (Signed)
    MRN: [id number] DOB: May 01, 1960  Subjective:   Chelsea Mckay is a 55 y.o. female presenting for chief complaint of Back Pain and Other  Reports 1 week history of left-sided mid-upper back pain intermittently radiating to her left lateral ribs. Has tried Alleve and Advil with minimal relief. Before, this would really help patient but not for this episode. Patient was seen at [facility] ~2 days ago and has been using Robaxin and naproxen but has not felt any relief of her back pain with this. Admits difficulty sleeping, high amount of stress due to her life responsibilities including her work, church and family. Denies fever, numbness or tingling, n/v, abdominal pain. Of note, patient is getting massages from a co-worker with significant relief. She works in document control, has difficulty working with people. She also has board responsibilities with her church. Her family is still very dependent on her still, including her adult children. Her husband passed away 3 years ago and has been difficult for patient but states the she is managing. She has tried seeing a couple of counselors but felt this did not help. Also, she has declined SSRI therapy due to potential for side effects. Denies SI, HI.  Hansini has a current medication list which includes the following prescription(s): methocarbamol, naproxen, terbinafine, albuterol, and fexofenadine. Also is allergic to adhesive and ciprofloxacin.  Quincie  has a past medical history of Allergy; Chest pressure; Depression; Asthma; Asthmatic bronchitis; Kidney stones; and Headache(784.0). Also  has past surgical history that includes Tubal ligation (1987); Bunionectomy (1999); Cervical polypectomy (2007); Colonoscopy; Colonoscopy w/ polypectomy; and Dilatation & curettage/hysteroscopy with trueclear (N/A, 09/24/2013).  Objective:   Vitals: BP 130/82 mmHg  Pulse 75  Temp(Src) 98.1 F (36.7 C) (Oral)  Resp 16  Ht 5' 5.5" (1.664 m)  Wt 174 lb 3.2 oz (79.017  kg)  BMI 28.54 kg/m2  SpO2 98%  LMP 03/29/2015  Physical Exam  Constitutional: She is oriented to person, place, and time. She appears well-developed and well-nourished.  HENT:  Mouth/Throat: Oropharynx is clear and moist.  Eyes: Pupils are equal, round, and reactive to light. Right eye exhibits no discharge. Left eye exhibits no discharge. No scleral icterus.  Neck: Normal range of motion. Neck supple. No thyromegaly present.  Cardiovascular: Normal rate, regular rhythm and intact distal pulses.  Exam reveals no gallop and no friction rub.   No murmur heard. Pulmonary/Chest: No respiratory distress. She has no wheezes. She has no rales.  Abdominal: Soft. Bowel sounds are normal. She exhibits no distension and no mass. There is no tenderness.  Neurological: She is alert and oriented to person, place, and time.  Skin: Skin is warm and dry.  Psychiatric: She exhibits a depressed mood (flat affect).   Assessment and Plan :   1. Depression 2. Emotional stress - Start Prozac, provided information for [facility] to help with behavioral therapy. F/u in 6 weeks or sooner if necessary.  3. Insomnia - Use lorazepam as needed once nightly.  4. Left-sided thoracic back pain - Stop robaxin, start Amrix. Stop anaprox, start meloxicam. RTC in 1-2 weeks if no improvement or worsening symptoms.  Jaynee Eagles, PA-C [facility] [phone] 05/23/2015 6:45 PM

## 2015-06-05 ENCOUNTER — Encounter: Payer: Self-pay | Admitting: Urgent Care

## 2015-06-15 ENCOUNTER — Ambulatory Visit (INDEPENDENT_AMBULATORY_CARE_PROVIDER_SITE_OTHER): Payer: BLUE CROSS/BLUE SHIELD | Admitting: Urgent Care

## 2015-06-15 ENCOUNTER — Ambulatory Visit (INDEPENDENT_AMBULATORY_CARE_PROVIDER_SITE_OTHER): Payer: BLUE CROSS/BLUE SHIELD

## 2015-06-15 VITALS — BP 124/80 | HR 106 | Temp 98.3°F | Resp 18 | Ht 65.5 in | Wt 173.0 lb

## 2015-06-15 DIAGNOSIS — M25532 Pain in left wrist: Secondary | ICD-10-CM

## 2015-06-15 DIAGNOSIS — F418 Other specified anxiety disorders: Secondary | ICD-10-CM | POA: Diagnosis not present

## 2015-06-15 DIAGNOSIS — M546 Pain in thoracic spine: Secondary | ICD-10-CM | POA: Diagnosis not present

## 2015-06-15 DIAGNOSIS — R8290 Unspecified abnormal findings in urine: Secondary | ICD-10-CM

## 2015-06-15 DIAGNOSIS — F419 Anxiety disorder, unspecified: Secondary | ICD-10-CM

## 2015-06-15 DIAGNOSIS — F32A Depression, unspecified: Secondary | ICD-10-CM

## 2015-06-15 DIAGNOSIS — F329 Major depressive disorder, single episode, unspecified: Secondary | ICD-10-CM

## 2015-06-15 LAB — POCT CBC
GRANULOCYTE PERCENT: 56.3 % (ref 37–80)
HCT, POC: 42.1 % (ref 37.7–47.9)
Hemoglobin: 14.8 g/dL (ref 12.2–16.2)
Lymph, poc: 2.5 (ref 0.6–3.4)
MCH, POC: 31.6 pg — AB (ref 27–31.2)
MCHC: 35.2 g/dL (ref 31.8–35.4)
MCV: 90 fL (ref 80–97)
MID (CBC): 0.3 (ref 0–0.9)
MPV: 8.3 fL (ref 0–99.8)
PLATELET COUNT, POC: 229 10*3/uL (ref 142–424)
POC Granulocyte: 3.5 (ref 2–6.9)
POC LYMPH %: 39.3 % (ref 10–50)
POC MID %: 4.4 %M (ref 0–12)
RBC: 4.68 M/uL (ref 4.04–5.48)
RDW, POC: 13.1 %
WBC: 6.3 10*3/uL (ref 4.6–10.2)

## 2015-06-15 LAB — POC MICROSCOPIC URINALYSIS (UMFC)

## 2015-06-15 LAB — POCT URINALYSIS DIP (MANUAL ENTRY)
Bilirubin, UA: NEGATIVE
Leukocytes, UA: NEGATIVE
NITRITE UA: NEGATIVE
UROBILINOGEN UA: 0.2
pH, UA: 5.5

## 2015-06-15 NOTE — Patient Instructions (Addendum)
Back Pain, Adult °Back pain is very common in adults. The cause of back pain is rarely dangerous and the pain often gets better over time. The cause of your back pain may not be known. Some common causes of back pain include: °· Strain of the muscles or ligaments supporting the spine. °· Wear and tear (degeneration) of the spinal disks. °· Arthritis. °· Direct injury to the back. °For many people, back pain may return. Since back pain is rarely dangerous, most people can learn to manage this condition on their own. °HOME CARE INSTRUCTIONS °Watch your back pain for any changes. The following actions may help to lessen any discomfort you are feeling: °· Remain active. It is stressful on your back to sit or stand in one place for long periods of time. Do not sit, drive, or stand in one place for more than 30 minutes at a time. Take short walks on even surfaces as soon as you are able. Try to increase the length of time you walk each day. °· Exercise regularly as directed by your health care provider. Exercise helps your back heal faster. It also helps avoid future injury by keeping your muscles strong and flexible. °· Do not stay in bed. Resting more than 1-2 days can delay your recovery. °· Pay attention to your body when you bend and lift. The most comfortable positions are those that put less stress on your recovering back. Always use proper lifting techniques, including: °· Bending your knees. °· Keeping the load close to your body. °· Avoiding twisting. °· Find a comfortable position to sleep. Use a firm mattress and lie on your side with your knees slightly bent. If you lie on your back, put a pillow under your knees. °· Avoid feeling anxious or stressed. Stress increases muscle tension and can worsen back pain. It is important to recognize when you are anxious or stressed and learn ways to manage it, such as with exercise. °· Take medicines only as directed by your health care provider. Over-the-counter  medicines to reduce pain and inflammation are often the most helpful. Your health care provider may prescribe muscle relaxant drugs. These medicines help dull your pain so you can more quickly return to your normal activities and healthy exercise. °· Apply ice to the injured area: °· Put ice in a plastic bag. °· Place a towel between your skin and the bag. °· Leave the ice on for 20 minutes, 2-3 times a day for the first 2-3 days. After that, ice and heat may be alternated to reduce pain and spasms. °· Maintain a healthy weight. Excess weight puts extra stress on your back and makes it difficult to maintain good posture. °SEEK MEDICAL CARE IF: °· You have pain that is not relieved with rest or medicine. °· You have increasing pain going down into the legs or buttocks. °· You have pain that does not improve in one week. °· You have night pain. °· You lose weight. °· You have a fever or chills. °SEEK IMMEDIATE MEDICAL CARE IF:  °· You develop new bowel or bladder control problems. °· You have unusual weakness or numbness in your arms or legs. °· You develop nausea or vomiting. °· You develop abdominal pain. °· You feel faint. °  °This information is not intended to replace advice given to you by your health care provider. Make sure you discuss any questions you have with your health care provider. °  °Document Released: 12/31/2004 Document Revised: 01/21/2014 Document Reviewed: 05/04/2013 °Elsevier Interactive Patient Education ©2016 Elsevier   Inc.    Wrist Pain There are many things that can cause wrist pain. Some common causes include:  An injury to the wrist area, such as a sprain, strain, or fracture.  Overuse of the joint.  A condition that causes increased pressure on a nerve in the wrist (carpal tunnel syndrome).  Wear and tear of the joints that occurs with aging (osteoarthritis).  A variety of other types of arthritis. Sometimes, the cause of wrist pain is not known. The pain often goes away  when you follow your health care provider's instructions for relieving pain at home. If your wrist pain continues, tests may need to be done to diagnose your condition. HOME CARE INSTRUCTIONS Pay attention to any changes in your symptoms. Take these actions to help with your pain:  Rest the wrist area for at least 48 hours or as told by your health care provider.  If directed, apply ice to the injured area:  Put ice in a plastic bag.  Place a towel between your skin and the bag.  Leave the ice on for 20 minutes, 2-3 times per day.  Keep your arm raised (elevated) above the level of your heart while you are sitting or lying down.  If a splint or elastic bandage has been applied, use it as told by your health care provider.  Remove the splint or bandage only as told by your health care provider.  Loosen the splint or bandage if your fingers become numb or have a tingling feeling, or if they turn cold or blue.  Take over-the-counter and prescription medicines only as told by your health care provider.  Keep all follow-up visits as told by your health care provider. This is important. SEEK MEDICAL CARE IF:  Your pain is not helped by treatment.  Your pain gets worse. SEEK IMMEDIATE MEDICAL CARE IF:  Your fingers become swollen.  Your fingers turn white, very red, or cold and blue.  Your fingers are numb or have a tingling feeling.  You have difficulty moving your fingers.   This information is not intended to replace advice given to you by your health care provider. Make sure you discuss any questions you have with your health care provider.   Document Released: 10/10/2004 Document Revised: 09/21/2014 Document Reviewed: 05/18/2014 Elsevier Interactive Patient Education 2016 [employer].     IF you received an x-ray today, you will receive an invoice from [facility]. Please contact [facility] at [phone] with questions or concerns regarding your  invoice.   IF you received labwork today, you will receive an invoice from [employer]. Please contact [facility] at [phone] with questions or concerns regarding your invoice.   Our billing staff will not be able to assist you with questions regarding bills from these companies.  You will be contacted with the lab results as soon as they are available. The fastest way to get your results is to activate your My Chart account. Instructions are located on the last page of this paperwork. If you have not heard from [place] regarding the results in 2 weeks, please contact this office.

## 2015-06-15 NOTE — Progress Notes (Signed)
MRN: [id number] DOB: Dec 11, 1960  Subjective:   Chelsea Mckay is a 55 y.o. female presenting for follow up on back pain, depression and anxiety.   Back Pain - Patient was seen initially 05/23/2015 for back. She has tried meloxicam, Anaprox, Flexeril, Amrix and has had only minimal relief. Patient states that her mid-back pain has also spread to her left flank side. The pain is not constant, worse at night after work. Most of her work activity is done in seated position, admits very poor posture. She denies fever, weight loss, numbness or tingling. Denies trauma, heavy lifting.  Wrist Pain - Reports ~2 month history of worsening wrist pain and intermittent swelling. Pain is achy, worst over her ulnar aspect. 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 She is right handed. Medications have been tried as above without any significant change.  Depression and Anxiety - Reports improvement in her mood after starting Prozac. She is no longer having crying spells. She tried unsuccessfully to get in touch with a therapist and will be trying again. Denies any thoughts about death, HI, SI. She stopped using lorazepam for sleep due to feeling sedated. She was using this daily. However, she would like to continue using Prozac.  Chelsea Mckay has a current medication list which includes the following prescription(s): fluoxetine, meloxicam, albuterol, cyclobenzaprine, fexofenadine, lorazepam, and terbinafine. Also is allergic to adhesive and ciprofloxacin.  Chelsea Mckay  has a past medical history of Allergy; Chest pressure; Depression; Asthma; Asthmatic bronchitis; Kidney stones; and Headache(784.0). Also  has past surgical history that includes Tubal ligation (1987); Bunionectomy (1999); Cervical polypectomy (2007); Colonoscopy; Colonoscopy w/ polypectomy; and Dilatation & curettage/hysteroscopy with trueclear (N/A, 09/24/2013).  Objective:   Vitals: BP 124/80 mmHg  Pulse 106  Temp(Src)  98.3 F (36.8 C)  Resp 18  Ht 5' 5.5" (1.664 m)  Wt 173 lb (78.472 kg)  BMI 28.34 kg/m2  SpO2 98%  LMP 03/29/2015  Physical Exam  Constitutional: She is oriented to person, place, and time. She appears well-developed and well-nourished.  HENT:  Mouth/Throat: Oropharynx is clear and moist.  Eyes: No scleral icterus.  Cardiovascular: Normal rate.   Pulmonary/Chest: Effort normal.  Musculoskeletal:       Thoracic back: She exhibits tenderness (over area outlined) and spasm (over trapezius and neck). She exhibits normal range of motion, no bony tenderness, no swelling, no edema, no deformity and no laceration.       Back:  Neurological: She is alert and oriented to person, place, and time. She has normal reflexes. No cranial nerve deficit.  Skin: Skin is warm and dry.  Psychiatric:  Patient appears much more cheerful. She is less stressed and overwhelmed when talking about her depression and anxiety.    Dg Ribs Unilateral W/chest Left  06/15/2015  CLINICAL DATA:  Left chest and rib pain. EXAM: LEFT RIBS AND CHEST - 3+ VIEW COMPARISON:  06/15/2015 FINDINGS: No fracture or other bone lesions are seen involving the ribs. There is no evidence of pneumothorax or pleural effusion. Both lungs are clear. Heart size and mediastinal contours are within normal limits. IMPRESSION: Negative. Electronically Signed   By: Jerilynn Mages.  Shick M.D.   On: 06/15/2015 19:28   Dg Thoracic Spine 2 View  06/15/2015  CLINICAL DATA:  Left-side thoracic spine pain. No known injury. Initial encounter. EXAM: THORACIC SPINE 2 VIEWS COMPARISON:  None. FINDINGS: No fracture or malalignment is identified. No notable degenerative disease is seen. Paraspinous structures are  unremarkable. IMPRESSION: Negative exam. Electronically Signed   By: Inge Rise M.D.   On: 06/15/2015 19:27   Dg Wrist Complete Right  06/15/2015  CLINICAL DATA:  Acute left wrist pain EXAM: RIGHT WRIST - COMPLETE 3+ VIEW COMPARISON:  None available FINDINGS:  There is no evidence of fracture or dislocation. There is no evidence of arthropathy or other focal bone abnormality. Soft tissues are unremarkable. IMPRESSION: Negative. Electronically Signed   By: Jerilynn Mages.  Shick M.D.   On: 06/15/2015 19:29     Results for orders placed or performed in visit on 06/15/15 (from the past 24 hour(s))  POCT CBC     Status: Abnormal   Collection Time: 06/15/15  7:35 PM  Result Value Ref Range   WBC 6.3 4.6 - 10.2 K/uL   Lymph, poc 2.5 0.6 - 3.4   POC LYMPH PERCENT 39.3 10 - 50 %L   MID (cbc) 0.3 0 - 0.9   POC MID % 4.4 0 - 12 %M   POC Granulocyte 3.5 2 - 6.9   Granulocyte percent 56.3 37 - 80 %G   RBC 4.68 4.04 - 5.48 M/uL   Hemoglobin 14.8 12.2 - 16.2 g/dL   HCT, POC 42.1 37.7 - 47.9 %   MCV 90.0 80 - 97 fL   MCH, POC 31.6 (A) 27 - 31.2 pg   MCHC 35.2 31.8 - 35.4 g/dL   RDW, POC 13.1 %   Platelet Count, POC 229 142 - 424 K/uL   MPV 8.3 0 - 99.8 fL  POCT urinalysis dipstick     Status: Abnormal   Collection Time: 06/15/15  7:36 PM  Result Value Ref Range   Color, UA yellow yellow   Clarity, UA clear clear   Glucose, UA =100 (A) negative   Bilirubin, UA negative negative   Ketones, POC UA trace (5) (A) negative   Spec Grav, UA >=1.030    Blood, UA moderate (A) negative   pH, UA 5.5    Protein Ur, POC =30 (A) negative   Urobilinogen, UA 0.2    Nitrite, UA Negative Negative   Leukocytes, UA Negative Negative  POCT Microscopic Urinalysis ([facility])     Status: Abnormal   Collection Time: 06/15/15  7:36 PM  Result Value Ref Range   WBC,UR,HPF,POC Few (A) None WBC/hpf   RBC,UR,HPF,POC Many (A) None RBC/hpf   Bacteria Few (A) None, Too numerous to count   Mucus Present (A) Absent   Epithelial Cells, UR Per Microscopy Few (A) None, Too numerous to count cells/hpf   Assessment and Plan :   1. Left-sided thoracic back pain 2. Left wrist pain - May be due to her poor posture, lack of hydration. Consider renal stones. Will refer to Physical Therapy. Work  restrictions provided. Follow up after completion of PT.  3. Anxiety and depression - Improved, continue Prozac daily. Continue lorazepam as needed but not daily. - Follow up in 4 weeks.  4. Abnormal urinalysis - Admits history of renal stones and not hydrating well. Urine culture pending. Recommended she strain her urine.  Jaynee Eagles, PA-C [facility] [phone] 06/15/2015 7:02 PM

## 2015-06-16 LAB — BASIC METABOLIC PANEL
BUN: 16 mg/dL (ref 7–25)
CO2: 27 mmol/L (ref 20–31)
Calcium: 9.7 mg/dL (ref 8.6–10.4)
Chloride: 104 mmol/L (ref 98–110)
Creat: 0.94 mg/dL (ref 0.50–1.05)
GLUCOSE: 104 mg/dL — AB (ref 65–99)
POTASSIUM: 4.2 mmol/L (ref 3.5–5.3)
SODIUM: 141 mmol/L (ref 135–146)

## 2015-06-17 LAB — URINE CULTURE
COLONY COUNT: NO GROWTH
Organism ID, Bacteria: NO GROWTH

## 2015-06-21 ENCOUNTER — Encounter: Payer: Self-pay | Admitting: Urgent Care

## 2015-07-31 ENCOUNTER — Telehealth: Payer: Self-pay

## 2015-07-31 DIAGNOSIS — F32A Depression, unspecified: Secondary | ICD-10-CM

## 2015-07-31 DIAGNOSIS — R457 State of emotional shock and stress, unspecified: Secondary | ICD-10-CM

## 2015-07-31 DIAGNOSIS — F329 Major depressive disorder, single episode, unspecified: Secondary | ICD-10-CM

## 2015-07-31 MED ORDER — FLUOXETINE HCL 20 MG PO TABS: 20.0000 mg | ORAL_TABLET | Freq: Every day | ORAL | Status: DC

## 2015-07-31 NOTE — Telephone Encounter (Signed)
Script sent electronically 

## 2015-07-31 NOTE — Telephone Encounter (Signed)
Fluoxetine 20mg  tabs - Pharmacy requesting 90 day supply

## 2015-10-12 ENCOUNTER — Other Ambulatory Visit: Payer: Self-pay | Admitting: Urgent Care

## 2015-10-12 DIAGNOSIS — R457 State of emotional shock and stress, unspecified: Secondary | ICD-10-CM

## 2015-10-12 DIAGNOSIS — F329 Major depressive disorder, single episode, unspecified: Secondary | ICD-10-CM

## 2015-10-12 DIAGNOSIS — F32A Depression, unspecified: Secondary | ICD-10-CM

## 2015-10-13 NOTE — Telephone Encounter (Signed)
Please advise. 90 day supply -0-RF sent 07/31/2015. Does pt need appt?

## 2015-10-13 NOTE — Telephone Encounter (Signed)
90 day supply is appropriate, patient should follow up in 3-6 months.

## 2015-10-14 NOTE — Telephone Encounter (Signed)
Spoke w/pt.  She has CPE in December. Advised meds called in.

## 2015-12-11 ENCOUNTER — Other Ambulatory Visit: Payer: Self-pay | Admitting: *Deleted

## 2015-12-11 ENCOUNTER — Other Ambulatory Visit: Payer: Self-pay | Admitting: Internal Medicine

## 2015-12-11 DIAGNOSIS — Z1231 Encounter for screening mammogram for malignant neoplasm of breast: Secondary | ICD-10-CM

## 2015-12-14 ENCOUNTER — Ambulatory Visit (INDEPENDENT_AMBULATORY_CARE_PROVIDER_SITE_OTHER): Payer: BLUE CROSS/BLUE SHIELD | Admitting: Family Medicine

## 2015-12-14 VITALS — BP 136/80 | HR 87 | Temp 98.1°F | Resp 16 | Wt 174.0 lb

## 2015-12-14 DIAGNOSIS — N921 Excessive and frequent menstruation with irregular cycle: Secondary | ICD-10-CM | POA: Diagnosis not present

## 2015-12-14 DIAGNOSIS — N951 Menopausal and female climacteric states: Secondary | ICD-10-CM

## 2015-12-14 DIAGNOSIS — D259 Leiomyoma of uterus, unspecified: Secondary | ICD-10-CM

## 2015-12-14 LAB — POCT CBC
Granulocyte percent: 52.7 %G (ref 37–80)
HEMATOCRIT: 38.6 % (ref 37.7–47.9)
Hemoglobin: 13.8 g/dL (ref 12.2–16.2)
LYMPH, POC: 1.9 (ref 0.6–3.4)
MCH, POC: 32.6 pg — AB (ref 27–31.2)
MCHC: 35.8 g/dL — AB (ref 31.8–35.4)
MCV: 90.9 fL (ref 80–97)
MID (cbc): 0.5 (ref 0–0.9)
MPV: 8.3 fL (ref 0–99.8)
POC GRANULOCYTE: 2.6 (ref 2–6.9)
POC LYMPH %: 38.2 % (ref 10–50)
POC MID %: 9.1 % (ref 0–12)
Platelet Count, POC: 219 10*3/uL (ref 142–424)
RBC: 4.24 M/uL (ref 4.04–5.48)
RDW, POC: 13.8 %
WBC: 5 10*3/uL (ref 4.6–10.2)

## 2015-12-14 LAB — TSH: TSH: 1.69 mIU/L

## 2015-12-14 MED ORDER — MEDROXYPROGESTERONE ACETATE 10 MG PO TABS: 10.0000 mg | ORAL_TABLET | Freq: Every day | ORAL | 0 refills | Status: DC

## 2015-12-14 NOTE — Patient Instructions (Addendum)
Gynecology Dr. Crawford Givens [id number]    IF you received an x-ray today, you will receive an invoice from [facility]. Please contact [facility] Radiology at [phone] with questions or concerns regarding your invoice.   IF you received labwork today, you will receive an invoice from [employer]. Please contact [facility] at [phone] with questions or concerns regarding your invoice.   Our billing staff will not be able to assist you with questions regarding bills from these companies.  You will be contacted with the lab results as soon as they are available. The fastest way to get your results is to activate your My Chart account. Instructions are located on the last page of this paperwork. If you have not heard from [place] regarding the results in 2 weeks, please contact this office.

## 2015-12-14 NOTE — Progress Notes (Signed)
Chief Complaint  Patient presents with  . Menorrhagia    spotting x 3 days /large amount bright red x today/cramps/headache    HPI  Pt reports that she started spotting with light vaginal bleeding on Sunday, 4 days ago, she reports that it continued as a regular period requiring her to change pads every 3-4 hours for two day then yesterday she developed vaginal bleeding that was brisk and just "pouring out clots" and changing pads every 30 minutes.  She reports that she has a history of uterine fibroid and had D&C in 2015 with Gynecology.  She states that she went 8 months without a period prior to this bleeding.  She is awaiting menopause at this point.  She denies dizziness, palpitations or sob  Past Medical History:  Diagnosis Date  . Allergy   . Asthma   . Asthmatic bronchitis   . Chest pressure   . Depression   . Headache(784.0)    Barometric pressure migraine headaches  . Kidney stones     Current Outpatient Prescriptions  Medication Sig Dispense Refill  . albuterol (PROVENTIL HFA;VENTOLIN HFA) 108 (90 BASE) MCG/ACT inhaler Inhale 2 puffs into the lungs every 4 (four) hours as needed for wheezing or shortness of breath (cough, shortness of breath or wheezing.). 1 Inhaler 1  . FLUoxetine (PROZAC) 20 MG tablet Take 1 tablet (20 mg total) by mouth daily. 90 tablet 3  . FLUoxetine (PROZAC) 20 MG tablet TAKE 1 TABLET(20 MG) BY MOUTH DAILY 90 tablet 1  . ibuprofen (ADVIL,MOTRIN) 200 MG tablet Take 200 mg by mouth every 6 (six) hours as needed.    . cyclobenzaprine (AMRIX) 15 MG 24 hr capsule Take 1 capsule (15 mg total) by mouth daily as needed for muscle spasms. (Patient not taking: Reported on 12/14/2015) 30 capsule 6  . fexofenadine (ALLEGRA) 180 MG tablet Take 180 mg by mouth daily as needed for allergies.    Marland Kitchen LORazepam (ATIVAN) 0.5 MG tablet Take 1 tablet (0.5 mg total) by mouth at bedtime as needed for anxiety or sleep. (Patient not taking: Reported on 12/14/2015) 30 tablet 0   . medroxyPROGESTERone (PROVERA) 10 MG tablet Take 1 tablet (10 mg total) by mouth daily. 6 tablet 0   No current facility-administered medications for this visit.     Allergies:  Allergies  Allergen Reactions  . Adhesive [Tape] Rash  . Ciprofloxacin Nausea And Vomiting and Rash    Past Surgical History:  Procedure Laterality Date  . BUNIONECTOMY  1999  . CERVICAL POLYPECTOMY  2007  . COLONOSCOPY    . COLONOSCOPY W/ POLYPECTOMY    . DILATATION & CURETTAGE/HYSTEROSCOPY WITH TRUECLEAR N/A 09/24/2013   Procedure: DILATATION & CURETTAGE/HYSTEROSCOPY WITH TRUCLEAR and Endometrial Ablation;  Surgeon: Betsy Coder, MD;  Location: [facility];  Service: Gynecology;  Laterality: N/A;  . TUBAL LIGATION  1987    Social History   Social History  . Marital status: Widowed    Spouse name: Raquel Sarna  . Number of children: 3  . Years of education: 12   Occupational History  . Document Control Specialist [employer]   Social History Main Topics  . Smoking status: Never Smoker  . Smokeless tobacco: Never Used  . Alcohol use Yes     Comment: rare  . Drug use: No  . Sexual activity: Not Currently    Partners: Male   Other Topics Concern  . Not on file   Social History Narrative   Lives alone. Husband died unexpectedly of  a heart attack at work 11/15/2011. Good support from pastor and daughters.    Review of Systems  Constitutional: Negative for chills, fever and weight loss.  Respiratory: Negative for cough, hemoptysis and sputum production.   Cardiovascular: Negative for chest pain, palpitations and orthopnea.  Skin: Negative for itching and rash.    Objective: Vitals:   12/14/15 0904  BP: 136/80  Pulse: 87  Resp: 16  Temp: 98.1 F (36.7 C)  TempSrc: Oral  SpO2: 100%  Weight: 174 lb (78.9 kg)    Physical Exam  Constitutional: She is oriented to person, place, and time.  HENT:  Head: Normocephalic and atraumatic.  Eyes: Conjunctivae and EOM are normal.  Neck:  Normal range of motion. No thyromegaly present.  Cardiovascular: Normal rate, regular rhythm and normal heart sounds.   No murmur heard. Pulmonary/Chest: Effort normal and breath sounds normal. No respiratory distress.  Neurological: She is alert and oriented to person, place, and time.  Skin: Skin is warm. Capillary refill takes less than 2 seconds.    Gu: large clots noted coming from the cervical os Uterine fibroid palpable and tender No cervical lacerations Ovaries not palpable   Assessment and Plan Katsuko was seen today for menorrhagia.  Diagnoses and all orders for this visit:  Menorrhagia with irregular cycle -     POCT CBC -     TSH  Uterine leiomyoma, unspecified location Perimenopause  Discussed that she should follow up with Gynecology Advised to follow up with Gyne for [place] and discussion on management of menopause -     medroxyPROGESTERone (PROVERA) 10 MG tablet; Take 1 tablet (10 mg total) by mouth daily.     [place]

## 2016-01-03 ENCOUNTER — Encounter: Payer: BLUE CROSS/BLUE SHIELD | Admitting: Family Medicine

## 2016-01-10 ENCOUNTER — Ambulatory Visit (INDEPENDENT_AMBULATORY_CARE_PROVIDER_SITE_OTHER): Payer: BLUE CROSS/BLUE SHIELD | Admitting: Family Medicine

## 2016-01-10 ENCOUNTER — Encounter: Payer: Self-pay | Admitting: Family Medicine

## 2016-01-10 VITALS — BP 114/74 | HR 74 | Temp 98.7°F | Resp 16 | Ht 65.5 in | Wt 173.0 lb

## 2016-01-10 DIAGNOSIS — Z803 Family history of malignant neoplasm of breast: Secondary | ICD-10-CM | POA: Diagnosis not present

## 2016-01-10 DIAGNOSIS — R002 Palpitations: Secondary | ICD-10-CM | POA: Diagnosis not present

## 2016-01-10 DIAGNOSIS — Z23 Encounter for immunization: Secondary | ICD-10-CM | POA: Diagnosis not present

## 2016-01-10 DIAGNOSIS — N921 Excessive and frequent menstruation with irregular cycle: Secondary | ICD-10-CM

## 2016-01-10 DIAGNOSIS — K635 Polyp of colon: Secondary | ICD-10-CM | POA: Diagnosis not present

## 2016-01-10 DIAGNOSIS — Z0000 Encounter for general adult medical examination without abnormal findings: Secondary | ICD-10-CM | POA: Diagnosis not present

## 2016-01-10 LAB — POCT URINALYSIS DIP (MANUAL ENTRY)
GLUCOSE UA: NEGATIVE
Ketones, POC UA: NEGATIVE
NITRITE UA: NEGATIVE
Spec Grav, UA: 1.025
UROBILINOGEN UA: 0.2
pH, UA: 5.5

## 2016-01-10 NOTE — Patient Instructions (Addendum)
     IF you received an x-ray today, you will receive an invoice from [facility]. Please contact [facility]iology at [phone] with questions or concerns regarding your invoice.   IF you received labwork today, you will receive an invoice from [place]. Please contact [facility] at [phone] with questions or concerns regarding your invoice.   Our billing staff will not be able to assist you with questions regarding bills from these companies.  You will be contacted with the lab results as soon as they are available. The fastest way to get your results is to activate your My Chart account. Instructions are located on the last page of this paperwork. If you have not heard from [place] regarding the results in 2 weeks, please contact this office.      Palpitations A palpitation is the feeling that your heartbeat is irregular or is faster than normal. It may feel like your heart is fluttering or skipping a beat. Palpitations are usually not a serious problem. They may be caused by many things, including smoking, caffeine, alcohol, stress, and certain medicines. Although most causes of palpitations are not serious, palpitations can be a sign of a serious medical problem. In some cases, you may need further medical evaluation. Follow these instructions at home: Pay attention to any changes in your symptoms. Take these actions to help with your condition:  Avoid the following:  Caffeinated coffee, tea, soft drinks, diet pills, and energy drinks.  Chocolate.  Alcohol.  Do not use any tobacco products, such as cigarettes, chewing tobacco, and e-cigarettes. If you need help quitting, ask your health care provider.  Try to reduce your stress and anxiety. Things that can help you relax include:  Yoga.  Meditation.  Physical activity, such as swimming, jogging, or walking.  Biofeedback. This is a method that helps you learn to use your mind to control things in your body, such  as your heartbeats.  Get plenty of rest and sleep.  Take over-the-counter and prescription medicines only as told by your health care provider.  Keep all follow-up visits as told by your health care provider. This is important. Contact a health care provider if:  You continue to have a fast or irregular heartbeat after 24 hours.  Your palpitations occur more often. Get help right away if:  You have chest pain or shortness of breath.  You have a severe headache.  You feel dizzy or you faint. This information is not intended to replace advice given to you by your health care provider. Make sure you discuss any questions you have with your health care provider. Document Released: 12/29/1999 Document Revised: 06/05/2015 Document Reviewed: 09/15/2014 Elsevier Interactive Patient Education  2017 [employer].

## 2016-01-10 NOTE — Progress Notes (Signed)
Chief Complaint  Patient presents with  . Annual Exam    Subjective:  Chelsea Mckay is a 55 y.o. female here for a health maintenance visit.  Patient is established pt  Her mammogram is due January 3rd.  She gets the 3D mammogram.  Her menorrhagia resolved with provera She is due to start her period any day now In 3 days she has a Architect with Gyne to evaluate her uterine fibroid She has a history of hematuria and has blood in her urine today  Patient Active Problem List   Diagnosis Date Noted  . Breast mass in female 12/17/2011  . AR (allergic rhinitis) 11/28/2011  . Benign hematuria 11/28/2011  . Hyperglycemia 11/28/2011    Past Medical History:  Diagnosis Date  . Allergy   . Asthma   . Asthmatic bronchitis   . Chest pressure   . Depression   . Headache(784.0)    Barometric pressure migraine headaches  . Kidney stones     Past Surgical History:  Procedure Laterality Date  . BUNIONECTOMY  1999  . CERVICAL POLYPECTOMY  2007  . COLONOSCOPY    . COLONOSCOPY W/ POLYPECTOMY    . DILATATION & CURETTAGE/HYSTEROSCOPY WITH TRUECLEAR N/A 09/24/2013   Procedure: DILATATION & CURETTAGE/HYSTEROSCOPY WITH TRUCLEAR and Endometrial Ablation;  Surgeon: Betsy Coder, MD;  Location: [facility];  Service: Gynecology;  Laterality: N/A;  . TUBAL LIGATION  1987     Outpatient Medications Prior to Visit  Medication Sig Dispense Refill  . albuterol (PROVENTIL HFA;VENTOLIN HFA) 108 (90 BASE) MCG/ACT inhaler Inhale 2 puffs into the lungs every 4 (four) hours as needed for wheezing or shortness of breath (cough, shortness of breath or wheezing.). 1 Inhaler 1  . cyclobenzaprine (AMRIX) 15 MG 24 hr capsule Take 1 capsule (15 mg total) by mouth daily as needed for muscle spasms. 30 capsule 6  . FLUoxetine (PROZAC) 20 MG tablet Take 1 tablet (20 mg total) by mouth daily. 90 tablet 3  . FLUoxetine (PROZAC) 20 MG tablet TAKE 1 TABLET(20 MG) BY MOUTH DAILY 90 tablet 1  . ibuprofen  (ADVIL,MOTRIN) 200 MG tablet Take 200 mg by mouth every 6 (six) hours as needed.    Marland Kitchen LORazepam (ATIVAN) 0.5 MG tablet Take 1 tablet (0.5 mg total) by mouth at bedtime as needed for anxiety or sleep. 30 tablet 0  . fexofenadine (ALLEGRA) 180 MG tablet Take 180 mg by mouth daily as needed for allergies.    . medroxyPROGESTERone (PROVERA) 10 MG tablet Take 1 tablet (10 mg total) by mouth daily. 6 tablet 0   No facility-administered medications prior to visit.     Allergies  Allergen Reactions  . Adhesive [Tape] Rash  . Ciprofloxacin Nausea And Vomiting and Rash     Family History  Problem Relation Age of Onset  . Diabetes Mother   . Alzheimer's disease Mother   . Cancer Sister   . Diabetes Brother   . Heart disease Father   . Cancer Sister 43    breast ca  . Diabetes Sister   . Hypertension Sister   . Cancer Sister 65    breast ca  . Diabetes Brother   . Cancer Brother 29    prostate  . Cancer Brother 88    prostate ca     Health Habits: Dental Exam: up to date Eye Exam: up to date Exercise: 0 times/week on average Current exercise activities: walking/running Diet: balanced  Social History   Social History  .  Marital status: Widowed    Spouse name: Raquel Sarna  . Number of children: 3  . Years of education: 12   Occupational History  . 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 Smoking status: Never Smoker  . Smokeless tobacco: Never Used  . Alcohol use Yes     Comment: rare  . Drug use: No  . Sexual activity: Not Currently    Partners: Male   Other Topics Concern  . Not on file   Social History Narrative   Lives alone. Husband died unexpectedly of a heart attack at work 11/15/2011. Good support from pastor and daughters.   History  Alcohol Use  . Yes    Comment: rare   History  Smoking Status  . Never Smoker  Smokeless Tobacco  . Never Used   History  Drug Use No    GYN: Sexual Health Menstrual status:  regular menses LMP: has dysfunctional uterine bleeding, metrorrhagia Last pap smear: see HM section History of abnormal pap smears:   Sexually active: no Current contraception: none, took Provera to stop heavy bleeding  Health Maintenance: See under health Maintenance activity for review of completion dates as well. Immunization History  Administered Date(s) Administered  . Influenza Split 10/29/2011  . Influenza-Unspecified 11/03/2014, 11/03/2015  . Pneumococcal Conjugate-13 11/03/2014      Depression Screen-PHQ2/9 Depression screen [facility] 2/9 01/10/2016 06/15/2015 05/23/2015 04/25/2015 12/28/2014  Decreased Interest 0 1 2 0 1  Down, Depressed, Hopeless 0 2 1 3 1   PHQ - 2 Score 0 3 3 3 2   Altered sleeping - 3 3 3 1   Tired, decreased energy - 3 3 3 1   Change in appetite - 3 3 1 1   Feeling bad or failure about yourself  - 0 0 0 0  Trouble concentrating - 2 2 2 1   Moving slowly or fidgety/restless - 0 0 0 0  Suicidal thoughts - 0 0 0 0  PHQ-9 Score - 14 14 12 6   Difficult doing work/chores - - - - Somewhat difficult      Depression Severity and Treatment Recommendations:  0-4= None  5-9= Mild / Treatment: Support, educate to call if worse; return in one month  10-14= Moderate / Treatment: Support, watchful waiting; Antidepressant or Psycotherapy  15-19= Moderately severe / Treatment: Antidepressant OR Psychotherapy  >= 20 = Major depression, severe / Antidepressant AND Psychotherapy    Review of Systems   Review of Systems  Constitutional: Negative for chills, diaphoresis and fever.  HENT: Negative for congestion, hearing loss and sore throat.   Eyes: Negative for blurred vision, double vision and photophobia.  Respiratory: Negative for cough, shortness of breath, wheezing and stridor.   Cardiovascular: Positive for palpitations. Negative for chest pain.       Intermittent chest pains   Gastrointestinal: Negative for abdominal pain, nausea and vomiting.  Skin: Negative  for itching and rash.  Neurological: Negative for dizziness, tingling, tremors and headaches.  Psychiatric/Behavioral: Negative for depression and substance abuse. The patient is not nervous/anxious.     See HPI for ROS as well.   Health Maintenance exam Pap smear up to date Colonoscopy 2012 with benign polyps Mammogram up to date  Objective:   Vitals:   01/10/16 0945  BP: 114/74  Pulse: 74  Resp: 16  Temp: 98.7 F (37.1 C)  TempSrc: Oral  SpO2: 97%  Weight: 173 lb (78.5 kg)  Height: 5' 5.5" (1.664 m)    Body mass index  is 28.35 kg/m.  Physical Exam  Constitutional: She is oriented to person, place, and time. She appears well-developed and well-nourished.  HENT:  Head: Normocephalic and atraumatic.  Right Ear: External ear normal.  Left Ear: External ear normal.  Nose: Nose normal.  Mouth/Throat: Oropharynx is clear and moist.  Eyes: Conjunctivae and EOM are normal. Pupils are equal, round, and reactive to light. No scleral icterus.  Neck: Normal range of motion. Neck supple. No thyromegaly present.  Cardiovascular: Normal rate, regular rhythm, normal heart sounds and intact distal pulses.   No murmur heard. Pulmonary/Chest: Breath sounds normal. No respiratory distress. She has no wheezes. She has no rales. She exhibits no tenderness.  Abdominal: Soft. Bowel sounds are normal. She exhibits no distension and no mass. There is no tenderness. There is no rebound and no guarding.  Musculoskeletal: Normal range of motion. She exhibits no edema or tenderness.  Neurological: She is alert and oriented to person, place, and time. She has normal reflexes.  Skin: Skin is warm. No erythema.  Psychiatric: She has a normal mood and affect. Her behavior is normal. Judgment and thought content normal.    ECG with nsr, no twi, or st elevation   Assessment/Plan:   Patient was seen for a health maintenance exam.  Counseled the patient on health maintenance issues. Reviewed her  health mainteance schedule and ordered appropriate tests (see orders.) Counseled on regular exercise and weight management. Recommend regular eye exams and dental cleaning.   The following issues were addressed today for health maintenance:   Sharron was seen today for annual exam.  Diagnoses and all orders for this visit:  Palpitations- advised pt to stop caffeine as her symptoms start after consume caffeine or antihistamines -     EKG 12-Lead within normal limits  Family history of breast cancer in sister- continue with annual mammograms  Health maintenance examination- will check labs and give another tetanus vaccine -     CBC -     Lipid panel -     Hemoglobin A1c -     POCT urinalysis dipstick  Need for tetanus booster -     Td vaccine greater than or equal to 7yo preservative free IM  Benign colonic polyp -     HM COLONOSCOPY  Menorrhagia with irregular cycle-  Continue follow up with Gynecology  No Follow-up on file.    Body mass index is 28.35 kg/m.:  Discussed the patient's BMI with patient. The BMI body mass index is 28.35 kg/m.[id number]

## 2016-01-11 LAB — CBC
Hematocrit: 40.6 % (ref 34.0–46.6)
Hemoglobin: 13.7 g/dL (ref 11.1–15.9)
MCH: 31 pg (ref 26.6–33.0)
MCHC: 33.7 g/dL (ref 31.5–35.7)
MCV: 92 fL (ref 79–97)
PLATELETS: 238 10*3/uL (ref 150–379)
RBC: 4.42 x10E6/uL (ref 3.77–5.28)
RDW: 13.6 % (ref 12.3–15.4)
WBC: 3.9 10*3/uL (ref 3.4–10.8)

## 2016-01-11 LAB — LIPID PANEL
CHOLESTEROL TOTAL: 218 mg/dL — AB (ref 100–199)
Chol/HDL Ratio: 3.7 ratio units (ref 0.0–4.4)
HDL: 59 mg/dL (ref >39)
LDL Calculated: 134 mg/dL — ABNORMAL HIGH (ref 0–99)
Triglycerides: 126 mg/dL (ref 0–149)
VLDL CHOLESTEROL CAL: 25 mg/dL (ref 5–40)

## 2016-01-11 LAB — HEMOGLOBIN A1C
Est. average glucose Bld gHb Est-mCnc: 131 mg/dL
Hgb A1c MFr Bld: 6.2 % — ABNORMAL HIGH (ref 4.8–5.6)

## 2016-01-12 ENCOUNTER — Other Ambulatory Visit: Payer: Self-pay | Admitting: Obstetrics and Gynecology

## 2016-01-17 ENCOUNTER — Ambulatory Visit
Admission: RE | Admit: 2016-01-17 | Discharge: 2016-01-17 | Disposition: A | Discharge status: Home / Self Care | Payer: BLUE CROSS/BLUE SHIELD | Source: Ambulatory Visit | Attending: Internal Medicine | Admitting: Internal Medicine

## 2016-01-17 DIAGNOSIS — Z1231 Encounter for screening mammogram for malignant neoplasm of breast: Secondary | ICD-10-CM

## 2016-03-18 ENCOUNTER — Ambulatory Visit (INDEPENDENT_AMBULATORY_CARE_PROVIDER_SITE_OTHER): Payer: BLUE CROSS/BLUE SHIELD | Admitting: Family Medicine

## 2016-03-18 VITALS — BP 124/80 | HR 110 | Temp 98.6°F | Resp 18 | Ht 65.5 in | Wt 174.0 lb

## 2016-03-18 DIAGNOSIS — J309 Allergic rhinitis, unspecified: Secondary | ICD-10-CM | POA: Diagnosis not present

## 2016-03-18 DIAGNOSIS — H9203 Otalgia, bilateral: Secondary | ICD-10-CM | POA: Diagnosis not present

## 2016-03-18 MED ORDER — FLUTICASONE PROPIONATE 50 MCG/ACT NA SUSP: 2.0000 | Freq: Every day | NASAL | 6 refills | Status: DC

## 2016-03-18 NOTE — Progress Notes (Signed)
Subjective:  By signing my name below, I, Chelsea Mckay, attest that this documentation has been prepared under the direction and in the presence of Chelsea Agreste, MD Electronically Signed: Ladene Mckay, ED Scribe 03/18/2016 at 9:26 AM.   Patient ID: Chelsea Mckay, female    DOB: [birth year], 56 y.o.   MRN: [id number]  Chief Complaint  Patient presents with  . Ear Pain    BOTH/HURTS TO CHEW   HPI Chelsea Mckay is a 56 y.o. female who presents to Primary Care at [facility] complaining of intermittent bilateral ear pain, right worse than left, for over a month. Pt reports associated symptoms of sinus pressure, nasal congestion, HA and an elevated temperature of 99.8 F approximately 2 weeks ago but no recent fevers. She has tried Advil Allergy and Sinus with relief of HAs and sweet oil with 1 day of relief of ear pain. Pt denies ear discharge/drainage or hearing loss. She has used Allegra in the past for allergies, but has not been using that recently.  Patient Active Problem List   Diagnosis Date Noted  . Family history of breast cancer in sister 01/10/2016  . Breast mass in female 12/17/2011  . AR (allergic rhinitis) 11/28/2011  . Benign hematuria 11/28/2011  . Hyperglycemia 11/28/2011   Past Medical History:  Diagnosis Date  . Allergy   . Asthma   . Asthmatic bronchitis   . Chest pressure   . Depression   . Headache(784.0)    Barometric pressure migraine headaches  . Kidney stones    Past Surgical History:  Procedure Laterality Date  . BUNIONECTOMY  1999  . CERVICAL POLYPECTOMY  2007  . COLONOSCOPY    . COLONOSCOPY W/ POLYPECTOMY    . DILATATION & CURETTAGE/HYSTEROSCOPY WITH TRUECLEAR N/A 09/24/2013   Procedure: DILATATION & CURETTAGE/HYSTEROSCOPY WITH TRUCLEAR and Endometrial Ablation;  Surgeon: Betsy Coder, MD;  Location: [facility];  Service: Gynecology;  Laterality: N/A;  . TUBAL LIGATION  1987   Allergies  Allergen Reactions  . Adhesive [Tape] Rash  .  Ciprofloxacin Nausea And Vomiting and Rash   Prior to Admission medications   Medication Sig Start Date End Date Taking? Authorizing Provider  albuterol (PROVENTIL HFA;VENTOLIN HFA) 108 (90 BASE) MCG/ACT inhaler Inhale 2 puffs into the lungs every 4 (four) hours as needed for wheezing or shortness of breath (cough, shortness of breath or wheezing.). 12/28/14  Yes Elby Beck, FNP  FLUoxetine (PROZAC) 20 MG tablet TAKE 1 TABLET(20 MG) BY MOUTH DAILY 10/13/15  Yes Jaynee Eagles, PA-C  ibuprofen (ADVIL,MOTRIN) 200 MG tablet Take 200 mg by mouth every 6 (six) hours as needed.   Yes Historical Provider, MD  fexofenadine (ALLEGRA) 180 MG tablet Take 180 mg by mouth daily as needed for allergies. 07/28/11 12/28/14  Chelle Jeffery, PA-C  FLUoxetine (PROZAC) 20 MG tablet Take 1 tablet (20 mg total) by mouth daily. Patient not taking: Reported on 03/18/2016 07/31/15   Jaynee Eagles, PA-C  medroxyPROGESTERone (PROVERA) 10 MG tablet Take 1 tablet (10 mg total) by mouth daily. 12/14/15 12/20/15  Forrest Moron, MD   Social History   Social History  . Marital status: Widowed    Spouse name: Chelsea Mckay  . Number of children: 3  . Years of education: 12   Occupational History  . 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 Smoking status: Never Smoker  . Smokeless tobacco: Never Used  . Alcohol use Yes     Comment:  rare  . Drug use: No  . Sexual activity: Not Currently    Partners: Male   Other Topics Concern  . Not on file   Social History Narrative   Lives alone. Husband died unexpectedly of a heart attack at work 11/15/2011. Good support from pastor and daughters.   Review of Systems  Constitutional: Negative for fever.  HENT: Positive for congestion, ear pain and sinus pain. Negative for ear discharge.   Neurological: Positive for headaches.      Objective:   Physical Exam  Constitutional: She is oriented to person, place, and time. She appears well-developed  and well-nourished. No distress.  HENT:  Head: Normocephalic and atraumatic.  Right Ear: Hearing, tympanic membrane, external ear and ear canal normal. No middle ear effusion.  Left Ear: Hearing, tympanic membrane, external ear and ear canal normal.  No middle ear effusion.  Nose: Mucosal edema (minimal edema of L turbinates) present. Right sinus exhibits no maxillary sinus tenderness and no frontal sinus tenderness. Left sinus exhibits no maxillary sinus tenderness and no frontal sinus tenderness.  Mouth/Throat: Oropharynx is clear and moist. No oropharyngeal exudate.  TMs: pearly grey  Eyes: Conjunctivae and EOM are normal. Pupils are equal, round, and reactive to light.  Cardiovascular: Normal rate, regular rhythm, normal heart sounds and intact distal pulses.   No murmur heard. Pulmonary/Chest: Effort normal and breath sounds normal. No respiratory distress. She has no wheezes. She has no rhonchi.  Neurological: She is alert and oriented to person, place, and time.  Skin: Skin is warm and dry. No rash noted.  Psychiatric: She has a normal mood and affect. Her behavior is normal.  Vitals reviewed.  Vitals:   03/18/16 0852  BP: 124/80  Pulse: (!) 110  Resp: 18  Temp: 98.6 F (37 C)  TempSrc: Oral  SpO2: 99%  Weight: 174 lb (78.9 kg)  Height: 5' 5.5" (1.664 m)   Heart rate normal rate/rhythm on physical exam.     Assessment & Plan:   Chelsea Mckay is a 56 y.o. female Ear pain, bilateral  Allergic rhinitis, unspecified chronicity, unspecified seasonality, unspecified trigger  History of allergic rhinitis, now with intermittent ear pain for the last 1 month that appears to be associated with flare of sinus headache. Possible eustachian tube dysfunction, increased pressure from sinuses. Ear exam appears normal. Hearing intact.  -Restart Allegra, Advair Flonase nasal spray 1-2 sprays per nostril each day. RTC precautions if symptoms persist or worsen.  Meds ordered this  encounter  Medications  . fluticasone (FLONASE) 50 MCG/ACT nasal spray    Sig: Place 2 sprays into both nostrils daily.    Dispense:  16 g    Refill:  6   Patient Instructions    Your ear pain may be due to allergies and pressure from the sinuses. Restart Allegra once per day, start Flonase nasal spray 1-2 sprays per nostril each day using the technique we discussed. If pains are not improving in the next 2 weeks, or any worsening sooner including any change in your  hearing, return for recheck here or I can refer you tenderness and throat.     IF you received an x-ray today, you will receive an invoice from [facility]. Please contact [facility] at [phone] with questions or concerns regarding your invoice.   IF you received labwork today, you will receive an invoice from [place]. Please contact [facility] at [phone] with questions or concerns regarding your invoice.   Our billing staff will  not be able to assist you with questions regarding bills from these companies.  You will be contacted with the lab results as soon as they are available. The fastest way to get your results is to activate your My Chart account. Instructions are located on the last page of this paperwork. If you have not heard from [place] regarding the results in 2 weeks, please contact this office.       I personally performed the services described in this documentation, which was scribed in my presence. The recorded information has been reviewed and considered for accuracy and completeness, addended by me as needed, and agree with information above.  Signed,   Merri Ray, MD [facility] [place].  03/18/16 9:39 AM

## 2016-03-18 NOTE — Patient Instructions (Addendum)
  Your ear pain may be due to allergies and pressure from the sinuses. Restart Allegra once per day, start Flonase nasal spray 1-2 sprays per nostril each day using the technique we discussed. If pains are not improving in the next 2 weeks, or any worsening sooner including any change in your  hearing, return for recheck here or I can refer you tenderness and throat.     IF you received an x-ray today, you will receive an invoice from [facility]. Please contact [facility] at [phone] with questions or concerns regarding your invoice.   IF you received labwork today, you will receive an invoice from [place]. Please contact [facility] at [phone] with questions or concerns regarding your invoice.   Our billing staff will not be able to assist you with questions regarding bills from these companies.  You will be contacted with the lab results as soon as they are available. The fastest way to get your results is to activate your My Chart account. Instructions are located on the last page of this paperwork. If you have not heard from [place] regarding the results in 2 weeks, please contact this office.

## 2016-04-01 ENCOUNTER — Encounter: Payer: Self-pay | Admitting: Family Medicine

## 2016-10-21 ENCOUNTER — Encounter: Payer: Self-pay | Admitting: Physician Assistant

## 2016-10-21 ENCOUNTER — Ambulatory Visit (INDEPENDENT_AMBULATORY_CARE_PROVIDER_SITE_OTHER): Payer: BLUE CROSS/BLUE SHIELD | Admitting: Physician Assistant

## 2016-10-21 VITALS — BP 124/82 | HR 75 | Temp 99.0°F | Resp 16 | Ht 64.0 in | Wt 177.4 lb

## 2016-10-21 DIAGNOSIS — R05 Cough: Secondary | ICD-10-CM | POA: Diagnosis not present

## 2016-10-21 DIAGNOSIS — J302 Other seasonal allergic rhinitis: Secondary | ICD-10-CM | POA: Diagnosis not present

## 2016-10-21 DIAGNOSIS — J4520 Mild intermittent asthma, uncomplicated: Secondary | ICD-10-CM | POA: Diagnosis not present

## 2016-10-21 DIAGNOSIS — R059 Cough, unspecified: Secondary | ICD-10-CM

## 2016-10-21 MED ORDER — OLOPATADINE HCL 0.2 % OP SOLN: 1.0000 [drp] | Freq: Every day | OPHTHALMIC | 0 refills | Status: DC

## 2016-10-21 MED ORDER — BENZONATATE 200 MG PO CAPS: 200.0000 mg | ORAL_CAPSULE | Freq: Two times a day (BID) | ORAL | 0 refills | Status: DC | PRN

## 2016-10-21 MED ORDER — ALBUTEROL SULFATE HFA 108 (90 BASE) MCG/ACT IN AERS: 2.0000 | INHALATION_SPRAY | RESPIRATORY_TRACT | 1 refills | Status: DC | PRN

## 2016-10-21 NOTE — Progress Notes (Signed)
10/21/2016 5:50 PM   DOB: 11-25-1960 / MRN: [id number]  SUBJECTIVE:  Chelsea Mckay is a 56 y.o. female presenting for a nagging cough that has seems to be getting worse.  This started about 4 days ago. She has a history of asthma.  Feels that this problem occurs every year and seems to be occurring earlier and earlier every year. Tells me that she has not needed a treatment in years.  Denies heartburn. No nausea or stomach pain.   She is allergic to adhesive [tape] and ciprofloxacin.   She  has a past medical history of Allergy; Asthma; Asthmatic bronchitis; Chest pressure; Depression; Headache(784.0); and Kidney stones.    She  reports that she has never smoked. She has never used smokeless tobacco. She reports that she drinks alcohol. She reports that she does not use drugs. She  reports that she does not currently engage in sexual activity but has had female partners.; The patient  has a past surgical history that includes Tubal ligation (1987); Bunionectomy (1999); Cervical polypectomy (2007); Colonoscopy; Colonoscopy w/ polypectomy; and Dilatation & curettage/hysteroscopy with trueclear (N/A, 09/24/2013).  Her family history includes Alzheimer's disease in her mother; Cancer in her sister; Cancer (age of onset: 20) in her sister; Cancer (age of onset: 45) in her sister; Cancer (age of onset: 44) in her brother; Cancer (age of onset: 69) in her brother; Diabetes in her brother, brother, mother, and sister; Heart disease in her father; Hypertension in her sister.  Review of Systems  Constitutional: Negative for chills, diaphoresis and fever.  Respiratory: Negative for cough, hemoptysis, sputum production, shortness of breath and wheezing.   Cardiovascular: Negative for chest pain.  Gastrointestinal: Negative for nausea.  Skin: Negative for rash.  Neurological: Negative for dizziness.    The problem list and medications were reviewed and updated by myself where necessary and exist elsewhere  in the encounter.   OBJECTIVE:  BP 124/82 (BP Location: Left Arm, Patient Position: Sitting, Cuff Size: Normal)   Pulse 75   Temp 99 F (37.2 C) (Oral)   Resp 16   Ht 5\' 4"  (1.626 m)   Wt 177 lb 6.4 oz (80.5 kg)   LMP 09/23/2016   SpO2 100%   BMI 30.45 kg/m   Physical Exam  Constitutional: She is active.  Non-toxic appearance.  Cardiovascular: Normal rate, regular rhythm, S1 normal, S2 normal, normal heart sounds and intact distal pulses.  Exam reveals no gallop, no friction rub and no decreased pulses.   No murmur heard. Pulmonary/Chest: Effort normal. No stridor. No tachypnea. No respiratory distress. She has no wheezes. She has no rales.  Abdominal: She exhibits no distension.  Musculoskeletal: She exhibits no edema.  Neurological: She is alert.  Skin: Skin is warm and dry. She is not diaphoretic. No pallor.    No results found for this or any previous visit (from the past 72 hour(s)).  No results found.  ASSESSMENT AND PLAN:  Chelsea Mckay was seen today for cough.  Diagnoses and all orders for this visit:  Mild intermittent asthma without complication -     albuterol (PROVENTIL HFA;VENTOLIN HFA) 108 (90 Base) MCG/ACT inhaler; Inhale 2 puffs into the lungs every 4 (four) hours as needed for wheezing or shortness of breath (cough, shortness of breath or wheezing.).  Cough -     benzonatate (TESSALON) 200 MG capsule; Take 1 capsule (200 mg total) by mouth 2 (two) times daily as needed for cough.  Seasonal allergies -  Olopatadine HCl 0.2 % SOLN; Apply 1 drop to eye daily.    The patient is advised to call or return to clinic if she does not see an improvement in symptoms, or to seek the care of the closest emergency department if she worsens with the above plan.   Philis Fendt, MHS, PA-C [facility] 10/21/2016 5:50 PM

## 2016-10-21 NOTE — Patient Instructions (Signed)
     IF you received an x-ray today, you will receive an invoice from [facility]. Please contact [facility] at [phone] with questions or concerns regarding your invoice.   IF you received labwork today, you will receive an invoice from [facility]. Please contact [facility] at [phone] with questions or concerns regarding your invoice.   Our billing staff will not be able to assist you with questions regarding bills from these companies.  You will be contacted with the lab results as soon as they are available. The fastest way to get your results is to activate your My Chart account. Instructions are located on the last page of this paperwork. If you have not heard from us regarding the results in 2 weeks, please contact this office.     

## 2017-01-20 ENCOUNTER — Other Ambulatory Visit: Payer: Self-pay | Admitting: Family Medicine

## 2017-01-20 DIAGNOSIS — Z1231 Encounter for screening mammogram for malignant neoplasm of breast: Secondary | ICD-10-CM

## 2017-02-05 ENCOUNTER — Encounter: Payer: Self-pay | Admitting: Family Medicine

## 2017-02-05 ENCOUNTER — Other Ambulatory Visit: Payer: Self-pay

## 2017-02-05 ENCOUNTER — Ambulatory Visit (INDEPENDENT_AMBULATORY_CARE_PROVIDER_SITE_OTHER): Payer: Managed Care, Other (non HMO) | Admitting: Family Medicine

## 2017-02-05 VITALS — BP 132/86 | HR 80 | Temp 98.6°F | Resp 16 | Ht 64.0 in | Wt 176.6 lb

## 2017-02-05 DIAGNOSIS — Z8742 Personal history of other diseases of the female genital tract: Secondary | ICD-10-CM

## 2017-02-05 DIAGNOSIS — Z124 Encounter for screening for malignant neoplasm of cervix: Secondary | ICD-10-CM | POA: Diagnosis not present

## 2017-02-05 DIAGNOSIS — E785 Hyperlipidemia, unspecified: Secondary | ICD-10-CM

## 2017-02-05 DIAGNOSIS — N950 Postmenopausal bleeding: Secondary | ICD-10-CM | POA: Insufficient documentation

## 2017-02-05 DIAGNOSIS — K635 Polyp of colon: Secondary | ICD-10-CM | POA: Diagnosis not present

## 2017-02-05 DIAGNOSIS — Z9889 Other specified postprocedural states: Secondary | ICD-10-CM

## 2017-02-05 DIAGNOSIS — Z0000 Encounter for general adult medical examination without abnormal findings: Secondary | ICD-10-CM

## 2017-02-05 DIAGNOSIS — Z1211 Encounter for screening for malignant neoplasm of colon: Secondary | ICD-10-CM

## 2017-02-05 DIAGNOSIS — N921 Excessive and frequent menstruation with irregular cycle: Secondary | ICD-10-CM | POA: Insufficient documentation

## 2017-02-05 DIAGNOSIS — R7303 Prediabetes: Secondary | ICD-10-CM | POA: Diagnosis not present

## 2017-02-05 MED ORDER — MEDROXYPROGESTERONE ACETATE 10 MG PO TABS: 10.0000 mg | ORAL_TABLET | Freq: Every day | ORAL | 2 refills | Status: DC

## 2017-02-05 NOTE — Progress Notes (Deleted)
No chief complaint on file.   HPI  4 review of systems  Past Medical History:  Diagnosis Date  . Allergy   . Asthma   . Asthmatic bronchitis   . Chest pressure   . Depression   . Headache(784.0)    Barometric pressure migraine headaches  . Kidney stones     Current Outpatient Medications  Medication Sig Dispense Refill  . albuterol (PROVENTIL HFA;VENTOLIN HFA) 108 (90 Base) MCG/ACT inhaler Inhale 2 puffs into the lungs every 4 (four) hours as needed for wheezing or shortness of breath (cough, shortness of breath or wheezing.). 1 Inhaler 1  . benzonatate (TESSALON) 200 MG capsule Take 1 capsule (200 mg total) by mouth 2 (two) times daily as needed for cough. 20 capsule 0  . fexofenadine (ALLEGRA) 180 MG tablet Take 180 mg by mouth daily as needed for allergies.    Marland Kitchen FLUoxetine (PROZAC) 20 MG tablet TAKE 1 TABLET(20 MG) BY MOUTH DAILY 90 tablet 1  . fluticasone (FLONASE) 50 MCG/ACT nasal spray Place 2 sprays into both nostrils daily. 16 g 6  . ibuprofen (ADVIL,MOTRIN) 200 MG tablet Take 200 mg by mouth every 6 (six) hours as needed.    . medroxyPROGESTERone (PROVERA) 10 MG tablet Take 1 tablet (10 mg total) by mouth daily. 6 tablet 0  . Olopatadine HCl 0.2 % SOLN Apply 1 drop to eye daily. 2.5 mL 0   No current facility-administered medications for this visit.     Allergies:  Allergies  Allergen Reactions  . Adhesive [Tape] Rash  . Ciprofloxacin Nausea And Vomiting and Rash    Past Surgical History:  Procedure Laterality Date  . BUNIONECTOMY  1999  . CERVICAL POLYPECTOMY  2007  . COLONOSCOPY    . COLONOSCOPY W/ POLYPECTOMY    . DILATATION & CURETTAGE/HYSTEROSCOPY WITH TRUECLEAR N/A 09/24/2013   Procedure: DILATATION & CURETTAGE/HYSTEROSCOPY WITH TRUCLEAR and Endometrial Ablation;  Surgeon: Betsy Coder, MD;  Location: [facility];  Service: Gynecology;  Laterality: N/A;  . TUBAL LIGATION  1987    Social History   Socioeconomic History  . Marital status: Widowed   Spouse name: Chelsea Mckay  . Number of children: 3  . Years of education: 50  . Highest education level: Not on file  Social Needs  . Financial resource strain: Not on file  . Food insecurity - worry: Not on file  . Food insecurity - inability: Not on file  . Transportation needs - medical: Not on file  . Transportation needs - non-medical: Not on file  Occupational History  . Occupation: Production designer, theatre/television/film    Employer: [employer]  Tobacco Use  . Smoking status: Never Smoker  . Smokeless tobacco: Never Used  Substance and Sexual Activity  . Alcohol use: Yes    Comment: rare  . Drug use: No  . Sexual activity: Not Currently    Partners: Male  Other Topics Concern  . Not on file  Social History Narrative   Lives alone. Husband died unexpectedly of a heart attack at work 11/15/2011. Good support from pastor and daughters.    Family History  Problem Relation Age of Onset  . Diabetes Mother   . Alzheimer's disease Mother   . Cancer Sister   . Diabetes Brother   . Heart disease Father   . Cancer Sister 58       breast ca  . Diabetes Sister   . Hypertension Sister   . Cancer Sister 104  breast ca  . Diabetes Brother   . Cancer Brother 80       prostate  . Cancer Brother 79       prostate ca     ROS Review of Systems See HPI Constitution: No fevers or chills No malaise No diaphoresis Skin: No rash or itching Eyes: no blurry vision, no double vision GU: no dysuria or hematuria Neuro: no dizziness or headaches * all others reviewed and negative   Objective: There were no vitals filed for this visit.  Physical Exam  Assessment and Plan There are no diagnoses linked to this encounter.   Chelsea Mckay

## 2017-02-05 NOTE — Progress Notes (Signed)
Chief Complaint  Patient presents with  . Annual Exam    Subjective:  Chelsea Mckay is a 57 y.o. female here for a health maintenance visit.  Patient is established pt  Pt had a colposcopy 2011 and cerivcal polypectomy in 2007 She does not have a new sexual partner She denies smoking history  She had endometrial biopsy 01/12/2016 for postmenopausal bleeding   Patient Active Problem List   Diagnosis Date Noted  . Postmenopausal bleeding 02/05/2017  . Irregular intermenstrual bleeding 02/05/2017  . Family history of breast cancer in sister 01/10/2016  . Breast mass in female 12/17/2011  . AR (allergic rhinitis) 11/28/2011  . Benign hematuria 11/28/2011  . Hyperglycemia 11/28/2011  . Abnormal cervical Papanicolaou smear 04/15/2010    Past Medical History:  Diagnosis Date  . Allergy   . Anxiety   . Asthma   . Asthmatic bronchitis   . Chest pressure   . Depression   . Headache(784.0)    Barometric pressure migraine headaches  . Kidney stones     Past Surgical History:  Procedure Laterality Date  . BUNIONECTOMY  1999  . CERVICAL POLYPECTOMY  2007  . COLONOSCOPY    . COLONOSCOPY W/ POLYPECTOMY    . DILATATION & CURETTAGE/HYSTEROSCOPY WITH TRUECLEAR N/A 09/24/2013   Procedure: DILATATION & CURETTAGE/HYSTEROSCOPY WITH TRUCLEAR and Endometrial Ablation;  Surgeon: Betsy Coder, MD;  Location: [facility];  Service: Gynecology;  Laterality: N/A;  . TUBAL LIGATION  1987     Outpatient Medications Prior to Visit  Medication Sig Dispense Refill  . albuterol (PROVENTIL HFA;VENTOLIN HFA) 108 (90 Base) MCG/ACT inhaler Inhale 2 puffs into the lungs every 4 (four) hours as needed for wheezing or shortness of breath (cough, shortness of breath or wheezing.). 1 Inhaler 1  . benzonatate (TESSALON) 200 MG capsule Take 1 capsule (200 mg total) by mouth 2 (two) times daily as needed for cough. 20 capsule 0  . fluticasone (FLONASE) 50 MCG/ACT nasal spray Place 2 sprays into both nostrils  daily. 16 g 6  . ibuprofen (ADVIL,MOTRIN) 200 MG tablet Take 200 mg by mouth every 6 (six) hours as needed.    . Olopatadine HCl 0.2 % SOLN Apply 1 drop to eye daily. 2.5 mL 0  . fexofenadine (ALLEGRA) 180 MG tablet Take 180 mg by mouth daily as needed for allergies.    Marland Kitchen FLUoxetine (PROZAC) 20 MG tablet TAKE 1 TABLET(20 MG) BY MOUTH DAILY (Patient not taking: Reported on 02/05/2017) 90 tablet 1  . Influenza vac split quadrivalent PF (AFLURIA QUADRIVALENT) 0.5 ML injection Afluria Quad 2017-2018 (PF) 60 mcg/0.5 mL intramuscular syringe    . pneumococcal 23 valent vaccine (PNEUMOVAX 23) 25 MCG/0.5ML injection Pneumovax 23 25 mcg/0.5 mL injection syringe    . medroxyPROGESTERone (PROVERA) 10 MG tablet Take 1 tablet (10 mg total) by mouth daily. 6 tablet 0   No facility-administered medications prior to visit.     Allergies  Allergen Reactions  . Adhesive [Tape] Rash  . Ciprofloxacin Nausea And Vomiting and Rash     Family History  Problem Relation Age of Onset  . Diabetes Mother   . Alzheimer's disease Mother   . Cancer Sister   . Diabetes Brother   . Heart disease Father   . Cancer Sister 19       breast ca  . Diabetes Sister   . Hypertension Sister   . Cancer Sister 83       breast ca  . Diabetes Brother   .  Cancer Brother 22       prostate  . Cancer Brother 19       prostate ca     Health Habits: Dental Exam: up to date Eye Exam: up to date Exercise: 0 times/week on average Current exercise activities: walking/running   Social History   Socioeconomic History  . Marital status: Widowed    Spouse name: Raquel Sarna  . Number of children: 3  . Years of education: 59  . Highest education level: Not on file  Social Needs  . Financial resource strain: Not on file  . Food insecurity - worry: Not on file  . Food insecurity - inability: Not on file  . Transportation needs - medical: Not on file  . Transportation needs - non-medical: Not on file  Occupational History  .  Occupation: Production designer, theatre/television/film    Employer: [employer]  Tobacco Use  . Smoking status: Never Smoker  . Smokeless tobacco: Never Used  Substance and Sexual Activity  . Alcohol use: Yes    Comment: rare  . Drug use: No  . Sexual activity: Not Currently    Partners: Male  Other Topics Concern  . Not on file  Social History Narrative   Lives alone. Husband died unexpectedly of a heart attack at work 11/15/2011. Good support from pastor and daughters.   Social History   Substance and Sexual Activity  Alcohol Use Yes   Comment: rare   Social History   Tobacco Use  Smoking Status Never Smoker  Smokeless Tobacco Never Used   Social History   Substance and Sexual Activity  Drug Use No    GYN: Sexual Health Menstrual status: regular menses LMP: Patient's last menstrual period was 01/23/2017. Last pap smear: see HM section History of abnormal pap smears:  Sexually active: not currently Current contraception: abstinence  Health Maintenance: See under health Maintenance activity for review of completion dates as well. Immunization History  Administered Date(s) Administered  . Influenza Split 10/29/2011  . Influenza-Unspecified 11/03/2014, 11/03/2015, 10/31/2016  . Pneumococcal Conjugate-13 11/03/2014  . Tdap 01/10/2016  . Zoster Recombinat (Shingrix) 10/31/2016      Depression Screen-PHQ2/9 Depression screen [facility] 2/9 02/05/2017 10/21/2016 03/18/2016 01/10/2016 01/10/2016  Decreased Interest 0 0 0 0 0  Down, Depressed, Hopeless 0 0 0 0 0  PHQ - 2 Score 0 0 0 0 0  Altered sleeping - - - - -  Tired, decreased energy - - - - -  Change in appetite - - - - -  Feeling bad or failure about yourself  - - - - -  Trouble concentrating - - - - -  Moving slowly or fidgety/restless - - - - -  Suicidal thoughts - - - - -  PHQ-9 Score - - - - -  Difficult doing work/chores - - - - -      Depression Severity and Treatment Recommendations:  0-4= None  5-9= Mild  / Treatment: Support, educate to call if worse; return in one month  10-14= Moderate / Treatment: Support, watchful waiting; Antidepressant or Psycotherapy  15-19= Moderately severe / Treatment: Antidepressant OR Psychotherapy  >= 20 = Major depression, severe / Antidepressant AND Psychotherapy    Review of Systems   Review of Systems  Constitutional: Negative for chills, fever and weight loss.  HENT: Negative for congestion, hearing loss, sinus pain and tinnitus.   Eyes: Negative for blurred vision and double vision.  Respiratory: Negative for cough, shortness of breath and wheezing.  Cardiovascular: Negative for chest pain, palpitations and leg swelling.  Gastrointestinal: Negative for constipation, diarrhea, nausea and vomiting.  Genitourinary: Negative for dysuria, frequency and urgency.  Musculoskeletal: Negative for back pain, myalgias and neck pain.  Skin: Negative for itching and rash.  Neurological: Negative for dizziness, tingling, tremors and headaches.  Psychiatric/Behavioral: Negative for depression. The patient is not nervous/anxious.     See HPI for ROS as well.    Objective:   Vitals:   02/05/17 0951 02/05/17 1109  BP: (!) 145/92 132/86  Pulse: 80   Resp: 16   Temp: 98.6 F (37 C)   TempSrc: Oral   SpO2: 96%   Weight: 176 lb 9.6 oz (80.1 kg)   Height: 5\' 4"  (1.626 m)    BP Readings from Last 3 Encounters:  02/05/17 132/86  10/21/16 124/82  03/18/16 124/80    Body mass index is 30.31 kg/m.  Physical Exam  BP 132/86 (BP Location: Left Arm, Patient Position: Sitting, Cuff Size: Normal)   Pulse 80   Temp 98.6 F (37 C) (Oral)   Resp 16   Ht 5\' 4"  (1.626 m)   Wt 176 lb 9.6 oz (80.1 kg)   LMP 01/23/2017   SpO2 96%   BMI 30.31 kg/m   General Appearance:    Alert, cooperative, no distress, appears stated age  Head:    Normocephalic, without obvious abnormality, atraumatic  Eyes:    PERRL, conjunctiva/corneas clear, EOM's intact, both eyes    Ears:    Normal TM's and external ear canals, both ears  Nose:   Nares normal, septum midline, mucosa normal, no drainage    or sinus tenderness  Throat:   Lips, mucosa, and tongue normal; teeth and gums normal  Neck:   Supple, symmetrical, trachea midline, no adenopathy;    thyroid:  no enlargement/tenderness/nodules; no carotid   bruit or JVD  Back:     Symmetric, no curvature, ROM normal, no CVA tenderness  Lungs:     Clear to auscultation bilaterally, respirations unlabored  Chest Wall:    No tenderness or deformity   Heart:    Regular rate and rhythm, S1 and S2 normal, no murmur, rub   or gallop  Breast Exam:    No tenderness, masses, or nipple abnormality (chaperone present)  Abdomen:     Soft, non-tender, bowel sounds active all four quadrants,    no masses, no organomegaly  Genitalia:    Normal female without lesion, discharge or tenderness Pap smear performed today with chaperone present  Extremities:   Extremities normal, atraumatic, no cyanosis or edema  Pulses:   2+ and symmetric all extremities  Skin:   Skin color, texture, turgor normal, no rashes or lesions  Lymph nodes:   Cervical, supraclavicular, and axillary nodes normal  Neurologic:   CNII-XII intact, normal strength, sensation and reflexes    throughout       Assessment/Plan:   Patient was seen for a health maintenance exam.  Counseled the patient on health maintenance issues. Reviewed her health mainteance schedule and ordered appropriate tests (see orders.) Counseled on regular exercise and weight management. Recommend regular eye exams and dental cleaning.   The following issues were addressed today for health maintenance:   Carolan was seen today for annual exam.  Diagnoses and all orders for this visit:  Health maintenance examination- age appropriate screenings reviewed  Pap smear for cervical cancer screening- discussed pap guidelines -     Pap IG and HPV (high risk) DNA  detection  Postmenopausal bleeding- will check levels.  Refilled provera -     TSH -     CBC with Differential/Platelet  S/P cervical polypectomy  Prediabetes- advise diet and exercise If a1c>6.0% pt needs metformin -     Hemoglobin A1c -     Comprehensive metabolic panel -     TSH  Dyslipidemia, goal LDL below 130- reviewed previous labs -     Comprehensive metabolic panel -     Lipid panel -     TSH  Benign colon polyp Screen for colon cancer -     Ambulatory referral to Gastroenterology Discussed cancer screening Other orders -     medroxyPROGESTERone (PROVERA) 10 MG tablet; Take 1 tablet (10 mg total) by mouth daily for 6 days.    No Follow-up on file.    Body mass index is 30.31 kg/m.:  Discussed the patient's BMI with patient. The BMI body mass index is 30.31 kg/m.     Future Appointments  Date Time Provider [place]  02/06/2017  8:00 AM [facility] 2 [facility]    Patient Instructions   Try evening primrose oil or valerian root (at bedtime)  [email or web address]    IF you received an x-ray today, you will receive an invoice from [facility]. Please contact [facility] at [phone] with questions or concerns regarding your invoice.   IF you received labwork today, you will receive an invoice from [facility]. Please contact [facility] at [phone] with questions or concerns regarding your invoice.   Our billing staff will not be able to assist you with questions regarding bills from these companies.  You will be contacted with the lab results as soon as they are available. The fastest way to get your results is to activate your My Chart account. Instructions are located on the last page of this paperwork. If you have not heard from [place] regarding the results in 2 weeks, please contact this office.     Prediabetes Prediabetes is the condition of having a blood sugar (blood glucose) level that is higher than it  should be, but not high enough for you to be diagnosed with type 2 diabetes. Having prediabetes puts you at risk for developing type 2 diabetes (type 2 diabetes mellitus). Prediabetes may be called impaired glucose tolerance or impaired fasting glucose. Prediabetes usually does not cause symptoms. Your health care provider can diagnose this condition with blood tests. You may be tested for prediabetes if you are overweight and if you have at least one other risk factor for prediabetes. Risk factors for prediabetes include:  Having a family member with type 2 diabetes.  Being overweight or obese.  Being older than age 76.  Being of American-Indian, African-American, Hispanic/Latino, or Asian/Pacific Islander descent.  Having an inactive (sedentary) lifestyle.  Having a history of gestational diabetes or polycystic ovarian syndrome (PCOS).  Having low levels of good cholesterol (HDL-C) or high levels of blood fats (triglycerides).  Having high blood pressure.  What is blood glucose and how is blood glucose measured?  Blood glucose refers to the amount of glucose in your bloodstream. Glucose comes from eating foods that contain sugars and starches (carbohydrates) that the body breaks down into glucose. Your blood glucose level may be measured in mg/dL (milligrams per deciliter) or mmol/L (millimoles per liter).Your blood glucose may be checked with one or more of the following blood tests:  A fasting blood glucose (FBG) test. You will not be allowed to eat (you  will fast) for at least 8 hours before a blood sample is taken. ? A normal range for FBG is 70-100 mg/dl (3.9-5.6 mmol/L).  An A1c (hemoglobin A1c) blood test. This test provides information about blood glucose control over the previous 2?67months.  An oral glucose tolerance test (OGTT). This test measures your blood glucose twice: ? After fasting. This is your baseline level. ? Two hours after you drink a beverage that contains  glucose.  You may be diagnosed with prediabetes:  If your FBG is 100?125 mg/dL (5.6-6.9 mmol/L).  If your A1c level is 5.7?6.4%.  If your OGGT result is 140?199 mg/dL (7.8-11 mmol/L).  These blood tests may be repeated to confirm your diagnosis. What happens if blood glucose is too high? The pancreas produces a hormone (insulin) that helps move glucose from the bloodstream into cells. When cells in the body do not respond properly to insulin that the body makes (insulin resistance), excess glucose builds up in the blood instead of going into cells. As a result, high blood glucose (hyperglycemia) can develop, which can cause many complications. This is a symptom of prediabetes. What can happen if blood glucose stays higher than normal for a long time? Having high blood glucose for a long time is dangerous. Too much glucose in your blood can damage your nerves and blood vessels. Long-term damage can lead to complications from diabetes, which may include:  Heart disease.  Stroke.  Blindness.  Kidney disease.  Depression.  Poor circulation in the feet and legs, which could lead to surgical removal (amputation) in severe cases.  How can prediabetes be prevented from turning into type 2 diabetes?  To help prevent type 2 diabetes, take the following actions:  Be physically active. ? Do moderate-intensity physical activity for at least 30 minutes on at least 5 days of the week, or as much as told by your health care provider. This could be brisk walking, biking, or water aerobics. ? Ask your health care provider what activities are safe for you. A mix of physical activities may be best, such as walking, swimming, cycling, and strength training.  Lose weight as told by your health care provider. ? Losing 5-7% of your body weight can reverse insulin resistance. ? Your health care provider can determine how much weight loss is best for you and can help you lose weight safely.  Follow a  healthy meal plan. This includes eating lean proteins, complex carbohydrates, fresh fruits and vegetables, low-fat dairy products, and healthy fats. ? Follow instructions from your health care provider about eating or drinking restrictions. ? Make an appointment to see a diet and nutrition specialist (registered dietitian) to help you create a healthy eating plan that is right for you.  Do not smoke or use any tobacco products, such as cigarettes, chewing tobacco, and e-cigarettes. If you need help quitting, ask your health care provider.  Take over-the-counter and prescription medicines as told by your health care provider. You may be prescribed medicines that help lower the risk of type 2 diabetes.  This information is not intended to replace advice given to you by your health care provider. Make sure you discuss any questions you have with your health care provider. Document Released: 04/24/2015 Document Revised: 06/08/2015 Document Reviewed: 02/21/2015 Elsevier Interactive Patient Education  [employer].

## 2017-02-05 NOTE — Patient Instructions (Addendum)
Try evening primrose oil or valerian root (at bedtime)  [email or web address]    IF you received an x-ray today, you will receive an invoice from [facility]. Please contact [facility] at [phone] with questions or concerns regarding your invoice.   IF you received labwork today, you will receive an invoice from [facility]. Please contact [facility] at [phone] with questions or concerns regarding your invoice.   Our billing staff will not be able to assist you with questions regarding bills from these companies.  You will be contacted with the lab results as soon as they are available. The fastest way to get your results is to activate your My Chart account. Instructions are located on the last page of this paperwork. If you have not heard from [place] regarding the results in 2 weeks, please contact this office.     Prediabetes Prediabetes is the condition of having a blood sugar (blood glucose) level that is higher than it should be, but not high enough for you to be diagnosed with type 2 diabetes. Having prediabetes puts you at risk for developing type 2 diabetes (type 2 diabetes mellitus). Prediabetes may be called impaired glucose tolerance or impaired fasting glucose. Prediabetes usually does not cause symptoms. Your health care provider can diagnose this condition with blood tests. You may be tested for prediabetes if you are overweight and if you have at least one other risk factor for prediabetes. Risk factors for prediabetes include:  Having a family member with type 2 diabetes.  Being overweight or obese.  Being older than age 54.  Being of American-Indian, African-American, Hispanic/Latino, or Asian/Pacific Islander descent.  Having an inactive (sedentary) lifestyle.  Having a history of gestational diabetes or polycystic ovarian syndrome (PCOS).  Having low levels of good cholesterol (HDL-C) or high levels of blood fats (triglycerides).  Having high  blood pressure.  What is blood glucose and how is blood glucose measured?  Blood glucose refers to the amount of glucose in your bloodstream. Glucose comes from eating foods that contain sugars and starches (carbohydrates) that the body breaks down into glucose. Your blood glucose level may be measured in mg/dL (milligrams per deciliter) or mmol/L (millimoles per liter).Your blood glucose may be checked with one or more of the following blood tests:  A fasting blood glucose (FBG) test. You will not be allowed to eat (you will fast) for at least 8 hours before a blood sample is taken. ? A normal range for FBG is 70-100 mg/dl (3.9-5.6 mmol/L).  An A1c (hemoglobin A1c) blood test. This test provides information about blood glucose control over the previous 2?15months.  An oral glucose tolerance test (OGTT). This test measures your blood glucose twice: ? After fasting. This is your baseline level. ? Two hours after you drink a beverage that contains glucose.  You may be diagnosed with prediabetes:  If your FBG is 100?125 mg/dL (5.6-6.9 mmol/L).  If your A1c level is 5.7?6.4%.  If your OGGT result is 140?199 mg/dL (7.8-11 mmol/L).  These blood tests may be repeated to confirm your diagnosis. What happens if blood glucose is too high? The pancreas produces a hormone (insulin) that helps move glucose from the bloodstream into cells. When cells in the body do not respond properly to insulin that the body makes (insulin resistance), excess glucose builds up in the blood instead of going into cells. As a result, high blood glucose (hyperglycemia) can develop, which can cause many complications. This is a symptom of prediabetes. What can happen  if blood glucose stays higher than normal for a long time? Having high blood glucose for a long time is dangerous. Too much glucose in your blood can damage your nerves and blood vessels. Long-term damage can lead to complications from diabetes, which may  include:  Heart disease.  Stroke.  Blindness.  Kidney disease.  Depression.  Poor circulation in the feet and legs, which could lead to surgical removal (amputation) in severe cases.  How can prediabetes be prevented from turning into type 2 diabetes?  To help prevent type 2 diabetes, take the following actions:  Be physically active. ? Do moderate-intensity physical activity for at least 30 minutes on at least 5 days of the week, or as much as told by your health care provider. This could be brisk walking, biking, or water aerobics. ? Ask your health care provider what activities are safe for you. A mix of physical activities may be best, such as walking, swimming, cycling, and strength training.  Lose weight as told by your health care provider. ? Losing 5-7% of your body weight can reverse insulin resistance. ? Your health care provider can determine how much weight loss is best for you and can help you lose weight safely.  Follow a healthy meal plan. This includes eating lean proteins, complex carbohydrates, fresh fruits and vegetables, low-fat dairy products, and healthy fats. ? Follow instructions from your health care provider about eating or drinking restrictions. ? Make an appointment to see a diet and nutrition specialist (registered dietitian) to help you create a healthy eating plan that is right for you.  Do not smoke or use any tobacco products, such as cigarettes, chewing tobacco, and e-cigarettes. If you need help quitting, ask your health care provider.  Take over-the-counter and prescription medicines as told by your health care provider. You may be prescribed medicines that help lower the risk of type 2 diabetes.  This information is not intended to replace advice given to you by your health care provider. Make sure you discuss any questions you have with your health care provider. Document Released: 04/24/2015 Document Revised: 06/08/2015 Document Reviewed:  02/21/2015 Elsevier Interactive Patient Education  [employer].

## 2017-02-06 ENCOUNTER — Ambulatory Visit
Admission: RE | Admit: 2017-02-06 | Discharge: 2017-02-06 | Disposition: A | Discharge status: Home / Self Care | Payer: BLUE CROSS/BLUE SHIELD | Source: Ambulatory Visit | Attending: Family Medicine | Admitting: Family Medicine

## 2017-02-06 DIAGNOSIS — Z1231 Encounter for screening mammogram for malignant neoplasm of breast: Secondary | ICD-10-CM

## 2017-02-06 LAB — CBC WITH DIFFERENTIAL/PLATELET
BASOS ABS: 0 10*3/uL (ref 0.0–0.2)
Basos: 0 %
EOS (ABSOLUTE): 0 10*3/uL (ref 0.0–0.4)
Eos: 1 %
Hematocrit: 43.2 % (ref 34.0–46.6)
Hemoglobin: 14.8 g/dL (ref 11.1–15.9)
Immature Grans (Abs): 0 10*3/uL (ref 0.0–0.1)
Immature Granulocytes: 0 %
Lymphocytes Absolute: 2.1 10*3/uL (ref 0.7–3.1)
Lymphs: 50 %
MCH: 31 pg (ref 26.6–33.0)
MCHC: 34.3 g/dL (ref 31.5–35.7)
MCV: 91 fL (ref 79–97)
MONOS ABS: 0.3 10*3/uL (ref 0.1–0.9)
Monocytes: 7 %
NEUTROS PCT: 42 %
Neutrophils Absolute: 1.8 10*3/uL (ref 1.4–7.0)
PLATELETS: 250 10*3/uL (ref 150–379)
RBC: 4.77 x10E6/uL (ref 3.77–5.28)
RDW: 13.3 % (ref 12.3–15.4)
WBC: 4.3 10*3/uL (ref 3.4–10.8)

## 2017-02-06 LAB — LIPID PANEL
CHOL/HDL RATIO: 3.8 ratio (ref 0.0–4.4)
CHOLESTEROL TOTAL: 209 mg/dL — AB (ref 100–199)
HDL: 55 mg/dL (ref >39)
LDL CALC: 130 mg/dL — AB (ref 0–99)
Triglycerides: 119 mg/dL (ref 0–149)
VLDL Cholesterol Cal: 24 mg/dL (ref 5–40)

## 2017-02-06 LAB — COMPREHENSIVE METABOLIC PANEL
ALBUMIN: 4.7 g/dL (ref 3.5–5.5)
ALT: 11 IU/L (ref 0–32)
AST: 10 IU/L (ref 0–40)
Albumin/Globulin Ratio: 1.7 (ref 1.2–2.2)
Alkaline Phosphatase: 87 IU/L (ref 39–117)
BUN / CREAT RATIO: 16 (ref 9–23)
BUN: 15 mg/dL (ref 6–24)
Bilirubin Total: 1 mg/dL (ref 0.0–1.2)
CO2: 22 mmol/L (ref 20–29)
CREATININE: 0.91 mg/dL (ref 0.57–1.00)
Calcium: 9.5 mg/dL (ref 8.7–10.2)
Chloride: 102 mmol/L (ref 96–106)
GFR calc Af Amer: 82 mL/min/{1.73_m2} (ref >59)
GFR, EST NON AFRICAN AMERICAN: 71 mL/min/{1.73_m2} (ref >59)
GLOBULIN, TOTAL: 2.7 g/dL (ref 1.5–4.5)
Glucose: 150 mg/dL — ABNORMAL HIGH (ref 65–99)
Potassium: 4.5 mmol/L (ref 3.5–5.2)
SODIUM: 140 mmol/L (ref 134–144)
Total Protein: 7.4 g/dL (ref 6.0–8.5)

## 2017-02-06 LAB — TSH: TSH: 1.09 u[IU]/mL (ref 0.450–4.500)

## 2017-02-06 LAB — HEMOGLOBIN A1C
Est. average glucose Bld gHb Est-mCnc: 174 mg/dL
Hgb A1c MFr Bld: 7.7 % — ABNORMAL HIGH (ref 4.8–5.6)

## 2017-02-07 LAB — PAP IG AND HPV HIGH-RISK
HPV, HIGH-RISK: NEGATIVE
PAP SMEAR COMMENT: 0

## 2017-02-17 ENCOUNTER — Telehealth: Payer: Self-pay | Admitting: Family Medicine

## 2017-02-17 NOTE — Telephone Encounter (Signed)
Called pt per Dr. Nolon Rod to schedule an appt to go over diagnosis and medications.  When pt calls back, please make her the earliest Chelsea Mckay office visit for med refills.   Thanks!

## 2017-02-25 ENCOUNTER — Encounter: Payer: Self-pay | Admitting: Urgent Care

## 2017-02-25 ENCOUNTER — Ambulatory Visit: Payer: Managed Care, Other (non HMO) | Admitting: Urgent Care

## 2017-02-25 VITALS — BP 132/80 | HR 118 | Temp 98.2°F | Resp 17 | Ht 65.5 in | Wt 171.0 lb

## 2017-02-25 DIAGNOSIS — R0789 Other chest pain: Secondary | ICD-10-CM

## 2017-02-25 DIAGNOSIS — B9789 Other viral agents as the cause of diseases classified elsewhere: Secondary | ICD-10-CM | POA: Diagnosis not present

## 2017-02-25 DIAGNOSIS — J069 Acute upper respiratory infection, unspecified: Secondary | ICD-10-CM | POA: Diagnosis not present

## 2017-02-25 MED ORDER — BENZONATATE 100 MG PO CAPS: 100.0000 mg | ORAL_CAPSULE | Freq: Three times a day (TID) | ORAL | 0 refills | Status: DC | PRN

## 2017-02-25 MED ORDER — HYDROCOD POLST-CPM POLST ER 10-8 MG/5ML PO SUER: 5.0000 mL | Freq: Every evening | ORAL | 0 refills | Status: DC | PRN

## 2017-02-25 NOTE — Patient Instructions (Addendum)
For sore throat try using a honey-based tea. Use 3 teaspoons of honey with juice squeezed from half lemon. Place shaved pieces of ginger into 1/2-1 cup of water and warm over stove top. Then mix the ingredients and repeat every 4 hours as needed. Hydrate well with at least 2 liters (1 gallon) of water daily.     Viral Respiratory Infection A respiratory infection is an illness that affects part of the respiratory system, such as the lungs, nose, or throat. Most respiratory infections are caused by either viruses or bacteria. A respiratory infection that is caused by a virus is called a viral respiratory infection. Common types of viral respiratory infections include:  A cold.  The flu (influenza).  A respiratory syncytial virus (RSV) infection.  How do I know if I have a viral respiratory infection? Most viral respiratory infections cause:  A stuffy or runny nose.  Yellow or green nasal discharge.  A cough.  Sneezing.  Fatigue.  Achy muscles.  A sore throat.  Sweating or chills.  A fever.  A headache.  How are viral respiratory infections treated? If influenza is diagnosed early, it may be treated with an antiviral medicine that shortens the length of time a person has symptoms. Symptoms of viral respiratory infections may be treated with over-the-counter and prescription medicines, such as:  Expectorants. These make it easier to cough up mucus.  Decongestant nasal sprays.  Health care providers do not prescribe antibiotic medicines for viral infections. This is because antibiotics are designed to kill bacteria. They have no effect on viruses. How do I know if I should stay home from work or school? To avoid exposing others to your respiratory infection, stay home if you have:  A fever.  A persistent cough.  A sore throat.  A runny nose.  Sneezing.  Muscles aches.  Headaches.  Fatigue.  Weakness.  Chills.  Sweating.  Nausea.  Follow these  instructions at home:  Rest as much as possible.  Take over-the-counter and prescription medicines only as told by your health care provider.  Drink enough fluid to keep your urine clear or pale yellow. This helps prevent dehydration and helps loosen up mucus.  Gargle with a salt-water mixture 3-4 times per day or as needed. To make a salt-water mixture, completely dissolve -1 tsp of salt in 1 cup of warm water.  Use nose drops made from salt water to ease congestion and soften raw skin around your nose.  Do not drink alcohol.  Do not use tobacco products, including cigarettes, chewing tobacco, and e-cigarettes. If you need help quitting, ask your health care provider. Contact a health care provider if:  Your symptoms last for 10 days or longer.  Your symptoms get worse over time.  You have a fever.  You have severe sinus pain in your face or forehead.  The glands in your jaw or neck become very swollen. Get help right away if:  You feel pain or pressure in your chest.  You have shortness of breath.  You faint or feel like you will faint.  You have severe and persistent vomiting.  You feel confused or disoriented. This information is not intended to replace advice given to you by your health care provider. Make sure you discuss any questions you have with your health care provider. Document Released: 10/10/2004 Document Revised: 06/08/2015 Document Reviewed: 06/08/2014 Elsevier Interactive Patient Education  2018 [employer].     IF you received an x-ray today, you will receive  an invoice from [facility]. Please contact [facility] at [phone] with questions or concerns regarding your invoice.   IF you received labwork today, you will receive an invoice from [facility]. Please contact [facility] at [phone] with questions or concerns regarding your invoice.   Our billing staff will not be able to assist you with questions regarding bills from  these companies.  You will be contacted with the lab results as soon as they are available. The fastest way to get your results is to activate your My Chart account. Instructions are located on the last page of this paperwork. If you have not heard from us regarding the results in 2 weeks, please contact this office.      

## 2017-02-25 NOTE — Progress Notes (Signed)
    MRN: [id number] DOB: [birth year]  Subjective:   Chelsea Mckay is a 57 y.o. female presenting for 4 day history of productive cough with post-tussive emesis (yesterday only), sinus congestion, sore throat (improved today), malaise, body aches. Cough elicits aching of her body, chest pain, back pain. Has tried otc cough syrup, Ricola. She also requested review of her labs.  Review of Systems  Constitutional: Negative for fever.  HENT: Negative for ear discharge, ear pain and sinus pain.   Respiratory: Negative for hemoptysis, shortness of breath and wheezing.   Skin: Negative for rash.    Chelsea Mckay has a current medication list which includes the following prescription(s): fexofenadine, fluticasone, ibuprofen, and olopatadine hcl. Also is allergic to adhesive [tape] and ciprofloxacin.  Chelsea Mckay  has a past medical history of Allergy, Anxiety, Asthma, Asthmatic bronchitis, Chest pressure, Depression, Headache(784.0), and Kidney stones. Also  has a past surgical history that includes Tubal ligation (1987); Bunionectomy (1999); Cervical polypectomy (2007); Colonoscopy; Colonoscopy w/ polypectomy; and Dilatation & curettage/hysteroscopy with trueclear (N/A, 09/24/2013).  Objective:   Vitals: BP 132/80   Pulse (!) 118   Temp 98.2 F (36.8 C) (Oral)   Resp 17   Ht 5' 5.5" (1.664 m)   Wt 171 lb (77.6 kg)   LMP 02/23/2017 (Approximate)   SpO2 98%   BMI 28.02 kg/m   BP Readings from Last 3 Encounters:  02/25/17 132/80  02/05/17 132/86  10/21/16 124/82    Lab Results  Component Value Date   HGBA1C 7.7 (H) 02/05/2017   Physical Exam  Constitutional: She is oriented to person, place, and time. She appears well-developed and well-nourished.  HENT:  TM's intact bilaterally, no effusions or erythema. Nasal turbinates pink, moist, nasal passages patent. No sinus tenderness. Oropharynx clear, mucous membranes moist.    Eyes: Right eye exhibits no discharge. Left eye exhibits no discharge.  Neck:  Normal range of motion. Neck supple.  Cardiovascular: Normal rate, regular rhythm and intact distal pulses. Exam reveals no gallop and no friction rub.  No murmur heard. Pulmonary/Chest: No respiratory distress. She has no wheezes. She has no rales.  Neurological: She is alert and oriented to person, place, and time.  Skin: Skin is warm and dry.  Psychiatric: She has a normal mood and affect.   Assessment and Plan :   Viral URI with cough  Atypical chest pain  Start supportive care for viral illness. Counseled patient on potential for adverse effects with medications prescribed today, patient verbalized understanding. Return-to-clinic precautions discussed, patient verbalized understanding. Reviewed labs with patient. Recommended follow up with Dr. Nolon Rod for continued management of her diabetes.  Jaynee Eagles, PA-C [facility] [phone] 02/25/2017  9:41 AM

## 2017-03-25 NOTE — Progress Notes (Signed)
Chief Complaint  Patient presents with  . Consult    talk about lab results     HPI   Diabetes Mellitus Type II, Initial Visit: Patient here for an initial evaluation of Type 2 diabetes mellitus.  Current symptoms/problems include none.  The patient was initially diagnosed with Type 2 diabetes mellitus based on the following criteria:   Lab Results  Component Value Date   HGBA1C 7.7 (H) 02/05/2017     Known diabetic complications: none Cardiovascular risk factors: diabetes mellitus, dyslipidemia and obesity (BMI >= 30 kg/m2) Current diabetic medications include oral agent (monotherapy): metformin (generic).   Eye exam current (within one year): yes Weight trend: decreasing steadily Prior visit with dietician: no Current diet: in general, a "healthy" diet   Current exercise: walking  Current monitoring regimen: none Any episodes of hypoglycemia? no  Is She on ACE inhibitor or angiotensin II receptor blocker?  Not Indicated    Lab Results  Component Value Date   CREATININE 0.91 02/05/2017   BP Readings from Last 3 Encounters:  03/26/17 104/62  02/25/17 132/80  02/05/17 132/86   Wt Readings from Last 3 Encounters:  03/26/17 166 lb 12.8 oz (75.7 kg)  02/25/17 171 lb (77.6 kg)  02/05/17 176 lb 9.6 oz (80.1 kg)     4 review of systems  Past Medical History:  Diagnosis Date  . Allergy   . Anxiety   . Asthma   . Asthmatic bronchitis   . Chest pressure   . Depression   . Headache(784.0)    Barometric pressure migraine headaches  . Kidney stones     Current Outpatient Medications  Medication Sig Dispense Refill  . benzonatate (TESSALON) 100 MG capsule Take 1-2 capsules (100-200 mg total) by mouth 3 (three) times daily as needed. 60 capsule 0  . chlorpheniramine-HYDROcodone (TUSSIONEX PENNKINETIC ER) 10-8 MG/5ML SUER Take 5 mLs by mouth at bedtime as needed. 100 mL 0  . fluticasone (FLONASE) 50 MCG/ACT nasal spray Place 2 sprays into both nostrils daily. 16  g 6  . ibuprofen (ADVIL,MOTRIN) 200 MG tablet Take 200 mg by mouth every 6 (six) hours as needed.    . Olopatadine HCl 0.2 % SOLN Apply 1 drop to eye daily. 2.5 mL 0  . Blood Glucose Monitoring Suppl (BLOOD GLUCOSE MONITOR SYSTEM) w/Device KIT Use to check blood glucose twice daily. ICD 10 code E11.9. Dispense type covered by insurance. 1 each 0  . fexofenadine (ALLEGRA) 180 MG tablet Take 180 mg by mouth daily as needed for allergies.    Marland Kitchen glucose blood test strip Use to check blood glucose twice daily. ICD 10 code E11.9. Dispense type covered by insurance. 100 each 12  . Lancet Devices (LANCING DEVICE) MISC Use to check blood glucose twice daily. ICD 10 code E11.9. Dispense type covered by insurance. 1 each 1  . Lancets 30G MISC Use to check blood glucose twice daily. ICD 10 code E11.9. Dispense type covered by insurance. 100 each 11  . metFORMIN (GLUCOPHAGE) 500 MG tablet Take 1 tablet (500 mg total) by mouth daily with breakfast. (Patient not taking: Reported on 03/26/2017) 90 tablet 1   No current facility-administered medications for this visit.     Allergies:  Allergies  Allergen Reactions  . Adhesive [Tape] Rash  . Ciprofloxacin Nausea And Vomiting and Rash    Past Surgical History:  Procedure Laterality Date  . BUNIONECTOMY  1999  . CERVICAL POLYPECTOMY  2007  . COLONOSCOPY    . COLONOSCOPY W/  POLYPECTOMY    . DILATATION & CURETTAGE/HYSTEROSCOPY WITH TRUECLEAR N/A 09/24/2013   Procedure: DILATATION & CURETTAGE/HYSTEROSCOPY WITH TRUCLEAR and Endometrial Ablation;  Surgeon: Betsy Coder, MD;  Location: [facility];  Service: Gynecology;  Laterality: N/A;  . TUBAL LIGATION  1987    Social History   Socioeconomic History  . Marital status: Widowed    Spouse name: Raquel Sarna  . Number of children: 3  . Years of education: 89  . Highest education level: None  Social Needs  . Financial resource strain: None  . Food insecurity - worry: None  . Food insecurity - inability: None  .  Transportation needs - medical: None  . Transportation needs - non-medical: None  Occupational History  . Occupation: Production designer, theatre/television/film    Employer: [employer]  Tobacco Use  . Smoking status: Never Smoker  . Smokeless tobacco: Never Used  Substance and Sexual Activity  . Alcohol use: Yes    Comment: rare  . Drug use: No  . Sexual activity: Not Currently    Partners: Male  Other Topics Concern  . None  Social History Narrative   Lives alone. Husband died unexpectedly of a heart attack at work 11/15/2011. Good support from pastor and daughters.    Family History  Problem Relation Age of Onset  . Diabetes Mother   . Alzheimer's disease Mother   . Cancer Sister   . Diabetes Brother   . Heart disease Father   . Cancer Sister 31       breast ca  . Breast cancer Sister 43  . Diabetes Sister   . Hypertension Sister   . Cancer Sister 65       breast ca  . Breast cancer Sister 63  . Diabetes Brother   . Cancer Brother 29       prostate  . Cancer Brother 47       prostate ca     ROS Review of Systems See HPI Constitution: No fevers or chills No malaise No diaphoresis Skin: No rash or itching Eyes: no blurry vision, no double vision GU: no dysuria or hematuria Neuro: no dizziness or headaches all others reviewed and negative   Objective: Vitals:   03/26/17 1543  BP: 104/62  Pulse: 73  Resp: 18  Temp: 98.4 F (36.9 C)  TempSrc: Oral  SpO2: 99%  Weight: 166 lb 12.8 oz (75.7 kg)  Height: 5' 5.5" (1.664 m)   Diabetic Foot Exam - Simple   Simple Foot Form Visual Inspection No deformities, no ulcerations, no other skin breakdown bilaterally:  Yes Sensation Testing Intact to touch and monofilament testing bilaterally:  Yes Pulse Check Posterior Tibialis and Dorsalis pulse intact bilaterally:  Yes Comments     Physical Exam  Constitutional: She appears well-developed and well-nourished.  HENT:  Head: Normocephalic and atraumatic.  Eyes:  Conjunctivae and EOM are normal.  Pulmonary/Chest: Effort normal.  Skin: Skin is warm. Capillary refill takes less than 2 seconds.  Psychiatric: She has a normal mood and affect. Her behavior is normal. Judgment and thought content normal.     Assessment and Plan Oriya was seen today for consult.  Diagnoses and all orders for this visit:  Newly diagnosed diabetes ([employer]) -     POCT glucose (manual entry) -     HM Diabetes Foot Exam -     Ambulatory referral to Nutrition and Diabetic Education -     Discontinue: lisinopril (PRINIVIL,ZESTRIL) 5 MG tablet; Take 1 tablet (5 mg  total) by mouth daily. (Patient not taking: Reported on 03/26/2017) -     metFORMIN (GLUCOPHAGE) 500 MG tablet; Take 1 tablet (500 mg total) by mouth daily with breakfast. (Patient not taking: Reported on 03/26/2017) -     Blood Glucose Monitoring Suppl (BLOOD GLUCOSE MONITOR SYSTEM) w/Device KIT; Use to check blood glucose twice daily. ICD 10 code E11.9. Dispense type covered by insurance. -     Lancet Devices (LANCING DEVICE) MISC; Use to check blood glucose twice daily. ICD 10 code E11.9. Dispense type covered by insurance. -     Lancets 30G MISC; Use to check blood glucose twice daily. ICD 10 code E11.9. Dispense type covered by insurance. -     glucose blood test strip; Use to check blood glucose twice daily. ICD 10 code E11.9. Dispense type covered by insurance. -     Lipid panel; Future -     Hemoglobin A1c; Future  -  Performed nutritional counseling for 15 minutes discussing glycemic index, proteins, exercise, metabolism, how metformin regulates sugars, pancreatic function and how to check blood glucoses -  Discussed diabetes standard of care -   Will do low dose metformin -   a1c recheck next month -   Referral placed for nutrition   A total of 25 minutes were spent face-to-face with the patient during this encounter and over half of that time was spent on counseling and coordination of care.  [place]

## 2017-03-26 ENCOUNTER — Encounter: Payer: Self-pay | Admitting: Family Medicine

## 2017-03-26 ENCOUNTER — Ambulatory Visit (INDEPENDENT_AMBULATORY_CARE_PROVIDER_SITE_OTHER): Payer: Managed Care, Other (non HMO) | Admitting: Family Medicine

## 2017-03-26 ENCOUNTER — Other Ambulatory Visit: Payer: Self-pay

## 2017-03-26 VITALS — BP 104/62 | HR 73 | Temp 98.4°F | Resp 18 | Ht 65.5 in | Wt 166.8 lb

## 2017-03-26 DIAGNOSIS — Z713 Dietary counseling and surveillance: Secondary | ICD-10-CM | POA: Diagnosis not present

## 2017-03-26 DIAGNOSIS — E119 Type 2 diabetes mellitus without complications: Secondary | ICD-10-CM

## 2017-03-26 LAB — GLUCOSE, POCT (MANUAL RESULT ENTRY): POC GLUCOSE: 104 mg/dL — AB (ref 70–99)

## 2017-03-26 MED ORDER — LANCETS 30G MISC: 11 refills | Status: AC

## 2017-03-26 MED ORDER — GLUCOSE BLOOD VI STRP: ORAL_STRIP | 12 refills | Status: AC

## 2017-03-26 MED ORDER — LISINOPRIL 5 MG PO TABS: 5.0000 mg | ORAL_TABLET | Freq: Every day | ORAL | 1 refills | Status: DC

## 2017-03-26 MED ORDER — LANCING DEVICE MISC: 1 refills | Status: AC

## 2017-03-26 MED ORDER — BLOOD GLUCOSE MONITOR SYSTEM W/DEVICE KIT: PACK | 0 refills | Status: AC

## 2017-03-26 MED ORDER — METFORMIN HCL 500 MG PO TABS: 500.0000 mg | ORAL_TABLET | Freq: Every day | ORAL | 1 refills | Status: DC

## 2017-03-26 NOTE — Patient Instructions (Addendum)
Diabetes: Care of Blood Sugar Test Supplies  Topic Overview Take care of your supplies so that you can test your blood sugar safely and get the most accurate blood sugar results. These results will be used to evaluate your treatment for diabetes . Meters. Follow the instructions from the manufacturer about how to care for the meter. All meters can be damaged by being dropped or jarred and by exposure to very hot or very cold temperatures. Some meters need to be cleaned regularly and have their batteries changed periodically.  Lancets. Lancets must be thrown away safely after they are used. Do not put lancets into your household wastebasket or trash can; a used lancet might accidentally stick someone. Put your used lancets in a plastic container (such as a small, empty soda pop bottle or laundry detergent bottle) and seal it when it is three-quarters full. Check with your local trash disposal agency about how you need to dispose of lancets. Some agencies have special ways to get rid of this type of waste.  Strips. Protect the test strips from dampness and humidity. Do not store them in the bathroom. Replace the lid on the strip bottle immediately after you take out a strip for testing.  Diabetes Mellitus and Standards of Medical Care Managing diabetes (diabetes mellitus) can be complicated. Your diabetes treatment may be managed by a team of health care providers, including:  A diet and nutrition specialist (registered dietitian).  A nurse.  A certified diabetes educator (CDE).  A diabetes specialist (endocrinologist).  An eye doctor.  A primary care provider.  A dentist.  Your health care providers follow a schedule in order to help you get the best quality of care. The following schedule is a general guideline for your diabetes management plan. Your health care providers may also give you more specific instructions. HbA1c ( hemoglobin A1c) test This test provides information about blood  sugar (glucose) control over the previous 2-3 months. It is used to check whether your diabetes management plan needs to be adjusted.  If you are meeting your treatment goals, this test is done at least 2 times a year.  If you are not meeting treatment goals or if your treatment goals have changed, this test is done 4 times a year.  Blood pressure test  This test is done at every routine medical visit. For most people, the goal is less than 130/80. Ask your health care provider what your goal blood pressure should be. Dental and eye exams  Visit your dentist two times a year.  If you have type 1 diabetes, get an eye exam 3-5 years after you are diagnosed, and then once a year after your first exam. ? If you were diagnosed with type 1 diabetes as a child, get an eye exam when you are age 9 or older and have had diabetes for 3-5 years. After the first exam, you should get an eye exam once a year.  If you have type 2 diabetes, have an eye exam as soon as you are diagnosed, and then once a year after your first exam. Foot care exam  Visual foot exams are done at every routine medical visit. The exams check for cuts, bruises, redness, blisters, sores, or other problems with the feet.  A complete foot exam is done by your health care provider once a year. This exam includes an inspection of the structure and skin of your feet, and a check of the pulses and sensation in your feet. ?  Type 1 diabetes: Get your first exam 3-5 years after diagnosis. ? Type 2 diabetes: Get your first exam as soon as you are diagnosed.  Check your feet every day for cuts, bruises, redness, blisters, or sores. If you have any of these or other problems that are not healing, contact your health care provider. Kidney function test ( urine microalbumin)  This test is done once a year. ? Type 1 diabetes: Get your first test 5 years after diagnosis. ? Type 2 diabetes: Get your first test as soon as you are  diagnosed.  If you have chronic kidney disease (CKD), get a serum creatinine and estimated glomerular filtration rate (eGFR) test once a year. Lipid profile (cholesterol, HDL, LDL, triglycerides)  This test should be done when you are diagnosed with diabetes, and every 5 years after the first test. If you are on medicines to lower your cholesterol, you may need to get this test done every year. ? The goal for LDL is less than 100 mg/dL (5.5 mmol/L). If you are at high risk, the goal is less than 70 mg/dL (3.9 mmol/L). ? The goal for HDL is 40 mg/dL (2.2 mmol/L) for men and 50 mg/dL(2.8 mmol/L) for women. An HDL cholesterol of 60 mg/dL (3.3 mmol/L) or higher gives some protection against heart disease. ? The goal for triglycerides is less than 150 mg/dL (8.3 mmol/L). Immunizations  The yearly flu (influenza) vaccine is recommended for everyone 6 months or older who has diabetes.  The pneumonia (pneumococcal) vaccine is recommended for everyone 2 years or older who has diabetes. If you are 84 or older, you may get the pneumonia vaccine as a series of two separate shots.  The hepatitis B vaccine is recommended for adults shortly after they have been diagnosed with diabetes.  The Tdap (tetanus, diphtheria, and pertussis) vaccine should be given: ? According to normal childhood vaccination schedules, for children. ? Every 10 years, for adults who have diabetes.  The shingles vaccine is recommended for people who have had chicken pox and are 50 years or older. Mental and emotional health  Screening for symptoms of eating disorders, anxiety, and depression is recommended at the time of diagnosis and afterward as needed. If your screening shows that you have symptoms (you have a positive screening result), you may need further evaluation and be referred to a mental health care provider. Diabetes self-management education  Education about how to manage your diabetes is recommended at diagnosis and  ongoing as needed. Treatment plan  Your treatment plan will be reviewed at every medical visit. Summary  Managing diabetes (diabetes mellitus) can be complicated. Your diabetes treatment may be managed by a team of health care providers.  Your health care providers follow a schedule in order to help you get the best quality of care.  Standards of care including having regular physical exams, blood tests, blood pressure monitoring, immunizations, screening tests, and education about how to manage your diabetes.  Your health care providers may also give you more specific instructions based on your individual health. This information is not intended to replace advice given to you by your health care provider. Make sure you discuss any questions you have with your health care provider. Document Released: 10/28/2008 Document Revised: 09/29/2015 Document Reviewed: 09/29/2015 Elsevier Interactive Patient Education  2018 [employer].   Low Glycemic Foods (20-49)  Breakfast Cereals: All-Bran                All-Bran Fruit ' n Oats  Fiber One               Oatmeal (not instant)  Oat bran Fruits and fruit juices: (Limit to 1-2 servings per day) Apples               Apricots (fresh & dried)  Blackberries            Blueberries Cherries                  Cranberries             Peaches                  Pears                       Plums                       Prunes Grapefruit                Raspberries            Strawberries           Tangerine Apple juice             Grapefruit juice Tomato juice  Beans and legumes (fresh-cooked): Black-eyed peas     Butter beans Chick peas              Lentils     Green beans           Lima beans               Kidney beans          Navy beans  Non-starchy vegetables: Asparagus, bok choy, broccoli, cabbage, cauliflower, celery, cucumber, greens, lettuce, mushrooms, peppers, tomatoes, okra, onions, snow peas, spinach, summer  squash  Grains: Barley                                Bulgur Rye                                    Wild rice  Nuts and oils : Almonds         Peanuts     Sunflower seeds  Hazelnuts      Pecans          Walnuts Oils that are liquid at room temperature  Dairy, fish, and meat: Milk, skim                         Lowfat cheese Yogurt, lowfat, fruit sugar sweetened Lean red meat                      Fish  Skinless chicken & [place]    Shellfish Moderate Glycemic Foods (50-69)  Breakfast Cereals: Bran Buds                             Bran Chex Just Right                            Mini-Wheats Special K         Swiss muesli  Fruits: Banana (under-ripe)  Dates Figs                                      Grapes Kiwi                                      Mango Oranges                               Raisins  Fruit Juices: Cranberry juice                    Orange juice  Beans and legumes: [place]-type baked beans Canned pinto, kidney, or navy beans Green peas  Vegetables: Beets                         Raw Carrots  Sweet potato              Yam Corn on the cob  Breads: Pita (pocket) bread          Oat bran bread Pumpernickel bread           Rye bread Wheat bread, high fiber       Grains: Cornmeal                           Rice, brown   Rice, white                         Couscous Pasta: Macaroni                           Pizza  cheese Raviolimeat filled           Spaghetti, white        Nuts: Cashews                           Macadamia  Snacks: Chocolate                    Ice cream,lowfat  Muffin                               Popcorn High Glycemic Foods (70-100)   Breakfast Cereals: Cheerios                 Corn Chex Corn Flakes            Cream of Wheat Grape Nuts              Grape Nut Flakes Life                 Nutri-Grain       Puffed Rice               Puffed Wheat Rice Chex                 Rice Krispies Shredded Wheat              Team Total Fruits: Pineapple                 Watermelon Banana (  over-ripe)  Beverages: Sodas, sweet tea, pineapple juice  Vegetables: Potato, baked, boiled, fried, mashed [place] fries Canned or frozen corn Cooked carrots Parsnips Winter squash  Breads: Most breads (white and whole grain) Bagels                     Bread sticks Bread stuffing          Kaiser roll Dinner rolls  Grains: Rice, instant          Tapioca, with milk  Candy and most cookies Snacks: Donuts                      Corn chips        Jelly beans                 Pretzels Pastries                             Restaurant and ethnic foods Most [place] food (sugar in stir fry or wok sauces) Teriyaki-style meats and vegetables

## 2017-03-28 ENCOUNTER — Encounter: Payer: Self-pay | Admitting: Family Medicine

## 2017-04-16 ENCOUNTER — Encounter: Payer: Self-pay | Admitting: Family Medicine

## 2017-04-16 DIAGNOSIS — K635 Polyp of colon: Secondary | ICD-10-CM

## 2017-04-16 HISTORY — DX: Polyp of colon: K63.5

## 2017-05-05 ENCOUNTER — Ambulatory Visit: Payer: Managed Care, Other (non HMO)

## 2017-05-05 DIAGNOSIS — E119 Type 2 diabetes mellitus without complications: Secondary | ICD-10-CM

## 2017-05-06 LAB — LIPID PANEL
CHOLESTEROL TOTAL: 178 mg/dL (ref 100–199)
Chol/HDL Ratio: 3.7 ratio (ref 0.0–4.4)
HDL: 48 mg/dL (ref >39)
LDL CALC: 115 mg/dL — AB (ref 0–99)
TRIGLYCERIDES: 73 mg/dL (ref 0–149)
VLDL Cholesterol Cal: 15 mg/dL (ref 5–40)

## 2017-05-06 LAB — HEMOGLOBIN A1C
Est. average glucose Bld gHb Est-mCnc: 131 mg/dL
HEMOGLOBIN A1C: 6.2 % — AB (ref 4.8–5.6)

## 2017-05-07 ENCOUNTER — Ambulatory Visit (INDEPENDENT_AMBULATORY_CARE_PROVIDER_SITE_OTHER): Payer: Managed Care, Other (non HMO) | Admitting: Family Medicine

## 2017-05-07 ENCOUNTER — Other Ambulatory Visit: Payer: Self-pay

## 2017-05-07 ENCOUNTER — Encounter: Payer: Self-pay | Admitting: Family Medicine

## 2017-05-07 VITALS — BP 127/84 | HR 75 | Temp 98.5°F | Resp 17 | Ht 65.5 in | Wt 160.8 lb

## 2017-05-07 DIAGNOSIS — E663 Overweight: Secondary | ICD-10-CM | POA: Diagnosis not present

## 2017-05-07 DIAGNOSIS — E119 Type 2 diabetes mellitus without complications: Secondary | ICD-10-CM | POA: Diagnosis not present

## 2017-05-07 DIAGNOSIS — E785 Hyperlipidemia, unspecified: Secondary | ICD-10-CM | POA: Diagnosis not present

## 2017-05-07 NOTE — Progress Notes (Signed)
Chief Complaint  Patient presents with  . Diabetes    4 week f/u    HPI   Diabetes Mellitus Type 2 Patient reports that she was walking after work  She states that she is sticking to a diabetic diet She states that she is motivated to keep her a1c at a goal of less than 6.5% She reports that she feels like she wants a snack at night She goes to the movies on Tuesdays and wants to have popcorn but her sugars were 215 Lab Results  Component Value Date   HGBA1C 6.2 (H) 05/05/2017    Overweight Patient discussed that she has been working on weight loss She is not eating junk food She is limiting fats  Body mass index is 26.35 kg/m. Wt Readings from Last 3 Encounters:  05/07/17 160 lb 12.8 oz (72.9 kg)  03/26/17 166 lb 12.8 oz (75.7 kg)  02/25/17 171 lb (77.6 kg)  02/05/2017      176 lb 9.6oz  Dyslipidemia Pt reports that she is eating a healthy diet with more fruits and veggies as well as cutting back on fats She states that she exercises without chest pain, palpitations or claudication She is not taking meds that would increase her lipids Lab Results  Component Value Date   CHOL 178 05/05/2017   CHOL 209 (H) 02/05/2017   CHOL 218 (H) 01/10/2016   Lab Results  Component Value Date   HDL 48 05/05/2017   HDL 55 02/05/2017   HDL 59 01/10/2016   Lab Results  Component Value Date   LDLCALC 115 (H) 05/05/2017   LDLCALC 130 (H) 02/05/2017   LDLCALC 134 (H) 01/10/2016   Lab Results  Component Value Date   TRIG 73 05/05/2017   TRIG 119 02/05/2017   TRIG 126 01/10/2016   Lab Results  Component Value Date   CHOLHDL 3.7 05/05/2017   CHOLHDL 3.8 02/05/2017   CHOLHDL 3.7 01/10/2016      Past Medical History:  Diagnosis Date  . Allergy   . Anxiety   . Asthma   . Asthmatic bronchitis   . Chest pressure   . Colon polyp 04/16/2017   Done 04/10/17. 1m polyp, transverse colon. Follow up cscopy in 5 years  . Depression   . Headache(784.0)    Barometric pressure  migraine headaches  . Kidney stones     Current Outpatient Medications  Medication Sig Dispense Refill  . benzonatate (TESSALON) 100 MG capsule Take 1-2 capsules (100-200 mg total) by mouth 3 (three) times daily as needed. 60 capsule 0  . Blood Glucose Monitoring Suppl (BLOOD GLUCOSE MONITOR SYSTEM) w/Device KIT Use to check blood glucose twice daily. ICD 10 code E11.9. Dispense type covered by insurance. 1 each 0  . chlorpheniramine-HYDROcodone (TUSSIONEX PENNKINETIC ER) 10-8 MG/5ML SUER Take 5 mLs by mouth at bedtime as needed. 100 mL 0  . cholecalciferol (VITAMIN D) 1000 units tablet Take 1,000 Units by mouth daily.    . fluticasone (FLONASE) 50 MCG/ACT nasal spray Place 2 sprays into both nostrils daily. 16 g 6  . glucose blood test strip Use to check blood glucose twice daily. ICD 10 code E11.9. Dispense type covered by insurance. 100 each 12  . ibuprofen (ADVIL,MOTRIN) 200 MG tablet Take 200 mg by mouth every 6 (six) hours as needed.    .Elmore GuiseDevices (LANCING DEVICE) MISC Use to check blood glucose twice daily. ICD 10 code E11.9. Dispense type covered by insurance. 1 each 1  . Lancets  30G MISC Use to check blood glucose twice daily. ICD 10 code E11.9. Dispense type covered by insurance. 100 each 11  . metFORMIN (GLUCOPHAGE) 500 MG tablet Take 1 tablet (500 mg total) by mouth daily with breakfast. (Patient taking differently: Take 250 mg by mouth daily with breakfast. ) 90 tablet 1  . Olopatadine HCl 0.2 % SOLN Apply 1 drop to eye daily. 2.5 mL 0  . thiamine (VITAMIN B-1) 100 MG tablet Take 100 mg by mouth daily.    . fexofenadine (ALLEGRA) 180 MG tablet Take 180 mg by mouth daily as needed for allergies.     No current facility-administered medications for this visit.     Allergies:  Allergies  Allergen Reactions  . Adhesive [Tape] Rash  . Ciprofloxacin Nausea And Vomiting and Rash    Past Surgical History:  Procedure Laterality Date  . BUNIONECTOMY  1999  . CERVICAL  POLYPECTOMY  2007  . COLONOSCOPY    . COLONOSCOPY W/ POLYPECTOMY    . DILATATION & CURETTAGE/HYSTEROSCOPY WITH TRUECLEAR N/A 09/24/2013   Procedure: DILATATION & CURETTAGE/HYSTEROSCOPY WITH TRUCLEAR and Endometrial Ablation;  Surgeon: Betsy Coder, MD;  Location: [facility];  Service: Gynecology;  Laterality: N/A;  . TUBAL LIGATION  1987    Social History   Socioeconomic History  . Marital status: Widowed    Spouse name: Raquel Sarna  . Number of children: 3  . Years of education: 41  . Highest education level: Not on file  Occupational History  . Occupation: Production designer, theatre/television/film    Employer: [employer]  Social Needs  . Financial resource strain: Not on file  . Food insecurity:    Worry: Not on file    Inability: Not on file  . Transportation needs:    Medical: Not on file    Non-medical: Not on file  Tobacco Use  . Smoking status: Never Smoker  . Smokeless tobacco: Never Used  Substance and Sexual Activity  . Alcohol use: Yes    Comment: rare  . Drug use: No  . Sexual activity: Not Currently    Partners: Male  Lifestyle  . Physical activity:    Days per week: Not on file    Minutes per session: Not on file  . Stress: Not on file  Relationships  . Social connections:    Talks on phone: Not on file    Gets together: Not on file    Attends religious service: Not on file    Active member of club or organization: Not on file    Attends meetings of clubs or organizations: Not on file    Relationship status: Not on file  Other Topics Concern  . Not on file  Social History Narrative   Lives alone. Husband died unexpectedly of a heart attack at work 11/15/2011. Good support from pastor and daughters.    Family History  Problem Relation Age of Onset  . Diabetes Mother   . Alzheimer's disease Mother   . Cancer Sister   . Diabetes Brother   . Heart disease Father   . Cancer Sister 51       breast ca  . Breast cancer Sister 31  . Diabetes Sister   .  Hypertension Sister   . Cancer Sister 52       breast ca  . Breast cancer Sister 56  . Diabetes Brother   . Cancer Brother 19       prostate  . Cancer Brother 80  prostate ca     ROS Review of Systems See HPI Constitution: No fevers or chills No malaise No diaphoresis Skin: No rash or itching Eyes: no blurry vision, no double vision GU: no dysuria or hematuria Neuro: no dizziness or headaches all others reviewed and negative   Objective: Vitals:   05/07/17 0900  BP: 127/84  Pulse: 75  Resp: 17  Temp: 98.5 F (36.9 C)  TempSrc: Oral  SpO2: 98%  Weight: 160 lb 12.8 oz (72.9 kg)  Height: 5' 5.5" (1.664 m)    Physical Exam  Constitutional: She is oriented to person, place, and time. She appears well-developed and well-nourished.  HENT:  Head: Normocephalic and atraumatic.  Right Ear: External ear normal.  Left Ear: External ear normal.  Eyes: Conjunctivae and EOM are normal.  Neck: Normal range of motion. No thyromegaly present.  Cardiovascular: Normal rate, regular rhythm and normal heart sounds.  No murmur heard. Pulmonary/Chest: Effort normal and breath sounds normal. No stridor. No respiratory distress. She has no wheezes.  Neurological: She is alert and oriented to person, place, and time.  Skin: Skin is warm. Capillary refill takes less than 2 seconds.  Psychiatric: She has a normal mood and affect. Her behavior is normal. Judgment and thought content normal.    Assessment and Plan Naiyana was seen today for diabetes.  Diagnoses and all orders for this visit:  Well controlled type 2 diabetes mellitus ([place])- at goal, continue metformin '250mg'$  daily and plan to d/c based on next a1c -     Microalbumin, urine  Dyslipidemia, goal LDL below 130- improved with dietary changes and exercise  Overweight- improved with changes to diet     Jarin Cornfield A Nolon Rod

## 2017-05-07 NOTE — Patient Instructions (Addendum)
IF you received an x-ray today, you will receive an invoice from [facility]. Please contact [facility] at [phone] with questions or concerns regarding your invoice.   IF you received labwork today, you will receive an invoice from [facility]. Please contact [facility] at [phone] with questions or concerns regarding your invoice.   Our billing staff will not be able to assist you with questions regarding bills from these companies.  You will be contacted with the lab results as soon as they are available. The fastest way to get your results is to activate your My Chart account. Instructions are located on the last page of this paperwork. If you have not heard from [place] regarding the results in 2 weeks, please contact this office.     Complementary and Alternative Medical Therapies for Diabetes Complementary and alternative medical therapies are treatments that are different from typical medical treatments (Western treatments). "Complementary" means that the therapy is used with Western treatments. "Alternative" means that the therapy is used instead of Western treatments. Are these therapies safe? Some of these therapies are usually safe. Others may be harmful. Often, there is not enough research to show how safe or effective a therapy is. If you want to try a complementary or alternative therapy, talk with your health care provider to make sure it is safe. What alternative or complementary therapies are used to treat diabetes? Acupuncture Acupuncture is the insertion of needles into certain places on the skin. This is done by a professional. It is often used to relieve long-term (chronic) pain, especially of the bones and joints. It may help you if you have painful nerve damage. Biofeedback Biofeedback helps you to become more aware of your body's response to pain. It also helps you to learn ways of dealing with pain. Biofeedback is about relaxing and reducing  stress. An example of a biofeedback technique is guided imagery. This involves creating peaceful images in your mind. Chromium Chromium is a substance that can help improve how insulin works in the body. Chromium is in many foods, including whole grains, nuts, and egg yolks. Chromium may also be taken as a supplement. Taking chromium supplements may help to control diabetes, especially if you have a lack of chromium (deficiency) in your body. However, research has not proven this. If you have kidney problems, you should be careful with chromium supplements. American ginseng American ginseng is an herb that may lower glucose levels. It may also help lower A1C levels. More research is needed before recommendations for ginseng use can be made. Magnesium Magnesium is a mineral found in many foods, such as whole grains, nuts, and green leafy vegetables. Having low magnesium levels may make controlling blood glucose more difficult for people who have type 2 diabetes. Low magnesium levels may also contribute to certain diabetes complications. Getting more magnesium and eating a high-fiber diet may reduce the risk of developing type 2 diabetes. Vanadium Vanadium is a compound found in small amounts in certain plants and animals. Some studies show that it improves glucose levels in animals with diabetes. In one study, people with diabetes were able to lower their insulin dosage when taking vanadium. More research about side effects and safe dosage levels is needed. Cinnamon Cinnamon may decrease insulin resistance, increase insulin production, and lower blood glucose levels. It may work best when used with diabetes medicines. Fenugreek Fenugreek is an herb whose seeds are often used in cooking. It may help lower blood glucose by decreasing carbohydrate absorption  and increasing insulin production. Summary  Talk with your health care provider about complementary or alternative therapy for you. Some therapies  may be appropriate for you, but others may cause side effects.  Follow your diabetes care plan as prescribed. This information is not intended to replace advice given to you by your health care provider. Make sure you discuss any questions you have with your health care provider. Document Released: 10/28/2006 Document Revised: 01/17/2016 Document Reviewed: 01/17/2016 Elsevier Interactive Patient Education  2017 [employer].

## 2017-05-08 ENCOUNTER — Encounter: Payer: Managed Care, Other (non HMO) | Attending: Family Medicine | Admitting: Registered"

## 2017-05-08 DIAGNOSIS — E119 Type 2 diabetes mellitus without complications: Secondary | ICD-10-CM | POA: Insufficient documentation

## 2017-05-08 DIAGNOSIS — Z713 Dietary counseling and surveillance: Secondary | ICD-10-CM | POA: Diagnosis present

## 2017-05-08 LAB — MICROALBUMIN, URINE: Microalbumin, Urine: 32.9 ug/mL

## 2017-05-09 ENCOUNTER — Encounter: Payer: Self-pay | Admitting: Family Medicine

## 2017-05-09 DIAGNOSIS — E119 Type 2 diabetes mellitus without complications: Secondary | ICD-10-CM | POA: Insufficient documentation

## 2017-05-09 NOTE — Progress Notes (Signed)
Patient was seen on 05/08/2017 for the first of a series of three diabetes self-management courses at [facility].  Patient Education Plan per assessed needs and concerns is to attend three course education program for Diabetes Self Management Education.  The following learning objectives were met by the patient during this class: ? Describe diabetes ? State some common risk factors for diabetes ? Defines the role of glucose and insulin ? Identifies type of diabetes and pathophysiology ? Describe the relationship between diabetes and cardiovascular risk ? State the members of the Healthcare Team ? States the rationale for glucose monitoring ? State when to test glucose ? State their individual Target Range ? State the importance of logging glucose readings ? Describe how to interpret glucose readings ? Identifies A1C target ? Explain the correlation between A1c and eAG values ? State symptoms and treatment of high blood glucose ? State symptoms and treatment of low blood glucose ? Explain proper technique for glucose testing ? Identifies proper sharps disposal  Handouts given during class include: ? Live Life Well with Diabetes ? Carb Counting and Meal Planning book ? Meal Plan Card ? Meal planning worksheet ? Low Sodium Flavoring Tips ? Types of Fats ? The diabetes portion plate ? A1c to eAG Conversion Chart ? Diabetes Recommended Care Schedule ? Support Group ? Diabetes Success Plan ? Core Class Satisfaction Survey   Follow-Up Plan: ? Attend core 2           

## 2017-05-15 ENCOUNTER — Encounter: Payer: Managed Care, Other (non HMO) | Attending: Family Medicine | Admitting: Dietician

## 2017-05-15 DIAGNOSIS — E119 Type 2 diabetes mellitus without complications: Secondary | ICD-10-CM | POA: Diagnosis present

## 2017-05-15 DIAGNOSIS — Z713 Dietary counseling and surveillance: Secondary | ICD-10-CM | POA: Insufficient documentation

## 2017-05-16 NOTE — Progress Notes (Signed)
Patient was seen on 05/15/17 for the second of a series of three diabetes self-management courses at the [facility]. The following learning objectives were met by the patient during this class:   Describe the role of different macronutrients on glucose  Explain how carbohydrates affect blood glucose  State what foods contain the most carbohydrates  Demonstrate carbohydrate counting  Demonstrate how to read Nutrition Facts food label  Describe effects of various fats on heart health  Describe the importance of good nutrition for health and healthy eating strategies  Describe techniques for managing your shopping, cooking and meal planning  List strategies to follow meal plan when dining out  Describe the effects of alcohol on glucose and how to use it safely  Goals:  Follow Diabetes Meal Plan as instructed  Aim to spread carbs evenly throughout the day  Aim for 3 meals per day and snacks as needed Include lean protein foods to meals/snacks  Monitor glucose levels as instructed by your doctor   Follow-Up Plan:  Attend Core 3  Work towards following your personal food plan.

## 2017-05-22 ENCOUNTER — Encounter: Payer: Managed Care, Other (non HMO) | Admitting: Dietician

## 2017-05-22 DIAGNOSIS — E119 Type 2 diabetes mellitus without complications: Secondary | ICD-10-CM

## 2017-05-22 DIAGNOSIS — Z713 Dietary counseling and surveillance: Secondary | ICD-10-CM | POA: Diagnosis not present

## 2017-05-22 NOTE — Progress Notes (Signed)
Patient was seen on 05/22/17 for the third of a series of three diabetes self-management courses at the [facility].   Catalina [place] the amount of activity recommended for healthy living . Describe activities suitable for individual needs . Identify ways to regularly incorporate activity into daily life . Identify barriers to activity and ways to over come these barriers  Identify diabetes medications being personally used and their primary action for lowering glucose and possible side effects . Describe role of stress on blood glucose and develop strategies to address psychosocial issues . Identify diabetes complications and ways to prevent them  Explain how to manage diabetes during illness . Evaluate success in meeting personal goal . Establish 2-3 goals that they will plan to diligently work on  Goals:   I will count my carb choices at most meals and snacks  I will record carb counts in my food log book  I will look at patterns in my record book at least 4 days a month on Sat or Sun evenings  Your patient has identified these potential barriers to change:  Motivation  Your patient has identified their diabetes self-care support plan as  Family Support On-line Resources   Plan:  Attend Support Group as desired

## 2017-08-04 ENCOUNTER — Ambulatory Visit: Payer: Managed Care, Other (non HMO)

## 2017-08-07 ENCOUNTER — Other Ambulatory Visit: Payer: Self-pay

## 2017-08-07 ENCOUNTER — Encounter: Payer: Self-pay | Admitting: Family Medicine

## 2017-08-07 ENCOUNTER — Ambulatory Visit (INDEPENDENT_AMBULATORY_CARE_PROVIDER_SITE_OTHER): Payer: Managed Care, Other (non HMO) | Admitting: Family Medicine

## 2017-08-07 VITALS — BP 104/70 | HR 70 | Temp 99.3°F | Resp 16 | Ht 65.5 in | Wt 153.6 lb

## 2017-08-07 DIAGNOSIS — Z1321 Encounter for screening for nutritional disorder: Secondary | ICD-10-CM

## 2017-08-07 DIAGNOSIS — Z23 Encounter for immunization: Secondary | ICD-10-CM

## 2017-08-07 DIAGNOSIS — E118 Type 2 diabetes mellitus with unspecified complications: Secondary | ICD-10-CM

## 2017-08-07 DIAGNOSIS — Z5181 Encounter for therapeutic drug level monitoring: Secondary | ICD-10-CM

## 2017-08-07 LAB — LIPID PANEL
CHOLESTEROL TOTAL: 179 mg/dL (ref 100–199)
Chol/HDL Ratio: 3.3 ratio (ref 0.0–4.4)
HDL: 55 mg/dL (ref >39)
LDL CALC: 107 mg/dL — AB (ref 0–99)
Triglycerides: 85 mg/dL (ref 0–149)
VLDL CHOLESTEROL CAL: 17 mg/dL (ref 5–40)

## 2017-08-07 LAB — COMPREHENSIVE METABOLIC PANEL
ALBUMIN: 4.3 g/dL (ref 3.5–5.5)
ALK PHOS: 73 IU/L (ref 39–117)
ALT: 13 IU/L (ref 0–32)
AST: 15 IU/L (ref 0–40)
Albumin/Globulin Ratio: 1.6 (ref 1.2–2.2)
BILIRUBIN TOTAL: 0.9 mg/dL (ref 0.0–1.2)
BUN / CREAT RATIO: 16 (ref 9–23)
BUN: 16 mg/dL (ref 6–24)
CHLORIDE: 105 mmol/L (ref 96–106)
CO2: 23 mmol/L (ref 20–29)
Calcium: 9.6 mg/dL (ref 8.7–10.2)
Creatinine, Ser: 1.02 mg/dL — ABNORMAL HIGH (ref 0.57–1.00)
GFR calc Af Amer: 71 mL/min/{1.73_m2} (ref >59)
GFR calc non Af Amer: 61 mL/min/{1.73_m2} (ref >59)
GLOBULIN, TOTAL: 2.7 g/dL (ref 1.5–4.5)
GLUCOSE: 137 mg/dL — AB (ref 65–99)
Potassium: 4.7 mmol/L (ref 3.5–5.2)
SODIUM: 143 mmol/L (ref 134–144)
Total Protein: 7 g/dL (ref 6.0–8.5)

## 2017-08-07 LAB — POCT GLYCOSYLATED HEMOGLOBIN (HGB A1C): Hemoglobin A1C: 5.9 % — AB (ref 4.0–5.6)

## 2017-08-07 NOTE — Patient Instructions (Addendum)
IF you received an x-ray today, you will receive an invoice from [facility]. Please contact [facility] at [phone] with questions or concerns regarding your invoice.   IF you received labwork today, you will receive an invoice from [place]. Please contact [facility] at [phone] with questions or concerns regarding your invoice.   Our billing staff will not be able to assist you with questions regarding bills from these companies.  You will be contacted with the lab results as soon as they are available. The fastest way to get your results is to activate your My Chart account. Instructions are located on the last page of this paperwork. If you have not heard from [place] regarding the results in 2 weeks, please contact this office.     Diabetes Mellitus and Exercise Exercising regularly is important for your overall health, especially when you have diabetes (diabetes mellitus). Exercising is not only about losing weight. It has many health benefits, such as increasing muscle strength and bone density and reducing body fat and stress. This leads to improved fitness, flexibility, and endurance, all of which result in better overall health. Exercise has additional benefits for people with diabetes, including:  Reducing appetite.  Helping to lower and control blood glucose.  Lowering blood pressure.  Helping to control amounts of fatty substances (lipids) in the blood, such as cholesterol and triglycerides.  Helping the body to respond better to insulin (improving insulin sensitivity).  Reducing how much insulin the body needs.  Decreasing the risk for heart disease by: ? Lowering cholesterol and triglyceride levels. ? Increasing the levels of good cholesterol. ? Lowering blood glucose levels.  What is my activity plan? Your health care provider or certified diabetes educator can help you make a plan for the type and frequency of exercise (activity plan) that  works for you. Make sure that you:  Do at least 150 minutes of moderate-intensity or vigorous-intensity exercise each week. This could be brisk walking, biking, or water aerobics. ? Do stretching and strength exercises, such as yoga or weightlifting, at least 2 times a week. ? Spread out your activity over at least 3 days of the week.  Get some form of physical activity every day. ? Do not go more than 2 days in a row without some kind of physical activity. ? Avoid being inactive for more than 90 minutes at a time. Take frequent breaks to walk or stretch.  Choose a type of exercise or activity that you enjoy, and set realistic goals.  Start slowly, and gradually increase the intensity of your exercise over time.  What do I need to know about managing my diabetes?  Check your blood glucose before and after exercising. ? If your blood glucose is higher than 240 mg/dL (13.3 mmol/L) before you exercise, check your urine for ketones. If you have ketones in your urine, do not exercise until your blood glucose returns to normal.  Know the symptoms of low blood glucose (hypoglycemia) and how to treat it. Your risk for hypoglycemia increases during and after exercise. Common symptoms of hypoglycemia can include: ? Hunger. ? Anxiety. ? Sweating and feeling clammy. ? Confusion. ? Dizziness or feeling light-headed. ? Increased heart rate or palpitations. ? Blurry vision. ? Tingling or numbness around the mouth, lips, or tongue. ? Tremors or shakes. ? Irritability.  Keep a rapid-acting carbohydrate snack available before, during, and after exercise to help prevent or treat hypoglycemia.  Avoid injecting insulin into areas of the body  that are going to be exercised. For example, avoid injecting insulin into: ? The arms, when playing tennis. ? The legs, when jogging.  Keep records of your exercise habits. Doing this can help you and your health care provider adjust your diabetes management plan  as needed. Write down: ? Food that you eat before and after you exercise. ? Blood glucose levels before and after you exercise. ? The type and amount of exercise you have done. ? When your insulin is expected to peak, if you use insulin. Avoid exercising at times when your insulin is peaking.  When you start a new exercise or activity, work with your health care provider to make sure the activity is safe for you, and to adjust your insulin, medicines, or food intake as needed.  Drink plenty of water while you exercise to prevent dehydration or heat stroke. Drink enough fluid to keep your urine clear or pale yellow. This information is not intended to replace advice given to you by your health care provider. Make sure you discuss any questions you have with your health care provider. Document Released: 03/23/2003 Document Revised: 07/21/2015 Document Reviewed: 06/12/2015 Elsevier Interactive Patient Education  2018 [employer].

## 2017-08-07 NOTE — Progress Notes (Signed)
Chief Complaint  Patient presents with  . A1C and cholesterol    3 months    HPI   Diabetes Mellitus: Patient presents for follow up of diabetes. Patient denies foot ulcerations, hyperglycemia, hypoglycemia , nausea, paresthesia of the feet, polydipsia and polyuria.  Evaluation to date has been included: fasting lipid panel and hemoglobin A1C.  Home sugars: BGs consistently in an acceptable range.  She reports that her averages are 110. Treatment to date: Decreased dose of metformin which has been effective. She takes metformin only 1/2 tablet a day. Lab Results  Component Value Date   HGBA1C 5.9 (A) 08/07/2017   Lab Results  Component Value Date   HGBA1C 5.9 (A) 08/07/2017   Wt Readings from Last 3 Encounters:  08/07/17 153 lb 9.6 oz (69.7 kg)  05/09/17 164 lb (74.4 kg)  05/07/17 160 lb 12.8 oz (72.9 kg)  02/05/17          176 lb 9.6 oz  Body mass index is 25.17 kg/m.    Past Medical History:  Diagnosis Date  . Allergy   . Anxiety   . Asthma   . Asthmatic bronchitis   . Chest pressure   . Colon polyp 04/16/2017   Done 04/10/17. 12m polyp, transverse colon. Follow up cscopy in 5 years  . Depression   . Headache(784.0)    Barometric pressure migraine headaches  . Kidney stones     Current Outpatient Medications  Medication Sig Dispense Refill  . benzonatate (TESSALON) 100 MG capsule Take 1-2 capsules (100-200 mg total) by mouth 3 (three) times daily as needed. 60 capsule 0  . Blood Glucose Monitoring Suppl (BLOOD GLUCOSE MONITOR SYSTEM) w/Device KIT Use to check blood glucose twice daily. ICD 10 code E11.9. Dispense type covered by insurance. 1 each 0  . chlorpheniramine-HYDROcodone (TUSSIONEX PENNKINETIC ER) 10-8 MG/5ML SUER Take 5 mLs by mouth at bedtime as needed. 100 mL 0  . fluticasone (FLONASE) 50 MCG/ACT nasal spray Place 2 sprays into both nostrils daily. 16 g 6  . glucose blood test strip Use to check blood glucose twice daily. ICD 10 code E11.9. Dispense type  covered by insurance. 100 each 12  . ibuprofen (ADVIL,MOTRIN) 200 MG tablet Take 200 mg by mouth every 6 (six) hours as needed.    .Elmore GuiseDevices (LANCING DEVICE) MISC Use to check blood glucose twice daily. ICD 10 code E11.9. Dispense type covered by insurance. 1 each 1  . Lancets 30G MISC Use to check blood glucose twice daily. ICD 10 code E11.9. Dispense type covered by insurance. 100 each 11  . metFORMIN (GLUCOPHAGE) 500 MG tablet Take 1 tablet (500 mg total) by mouth daily with breakfast. (Patient taking differently: Take 250 mg by mouth daily with breakfast. ) 90 tablet 1  . Olopatadine HCl 0.2 % SOLN Apply 1 drop to eye daily. 2.5 mL 0  . cholecalciferol (VITAMIN D) 1000 units tablet Take 1,000 Units by mouth daily.    . fexofenadine (ALLEGRA) 180 MG tablet Take 180 mg by mouth daily as needed for allergies.    .Marland Kitchenthiamine (VITAMIN B-1) 100 MG tablet Take 100 mg by mouth daily.     No current facility-administered medications for this visit.     Allergies:  Allergies  Allergen Reactions  . Adhesive [Tape] Rash  . Ciprofloxacin Nausea And Vomiting and Rash    Past Surgical History:  Procedure Laterality Date  . BUNIONECTOMY  1999  . CERVICAL POLYPECTOMY  2007  . COLONOSCOPY    .  COLONOSCOPY W/ POLYPECTOMY    . DILATATION & CURETTAGE/HYSTEROSCOPY WITH TRUECLEAR N/A 09/24/2013   Procedure: DILATATION & CURETTAGE/HYSTEROSCOPY WITH TRUCLEAR and Endometrial Ablation;  Surgeon: Betsy Coder, MD;  Location: [facility];  Service: Gynecology;  Laterality: N/A;  . TUBAL LIGATION  1987    Social History   Socioeconomic History  . Marital status: Widowed    Spouse name: Raquel Sarna  . Number of children: 3  . Years of education: 16  . Highest education level: Not on file  Occupational History  . Occupation: Production designer, theatre/television/film    Employer: [employer]  Social Needs  . Financial resource strain: Not on file  . Food insecurity:    Worry: Not on file    Inability: Not on  file  . Transportation needs:    Medical: Not on file    Non-medical: Not on file  Tobacco Use  . Smoking status: Never Smoker  . Smokeless tobacco: Never Used  Substance and Sexual Activity  . Alcohol use: Yes    Comment: rare  . Drug use: No  . Sexual activity: Not Currently    Partners: Male  Lifestyle  . Physical activity:    Days per week: Not on file    Minutes per session: Not on file  . Stress: Not on file  Relationships  . Social connections:    Talks on phone: Not on file    Gets together: Not on file    Attends religious service: Not on file    Active member of club or organization: Not on file    Attends meetings of clubs or organizations: Not on file    Relationship status: Not on file  Other Topics Concern  . Not on file  Social History Narrative   Lives alone. Husband died unexpectedly of a heart attack at work 11/15/2011. Good support from pastor and daughters.    Family History  Problem Relation Age of Onset  . Diabetes Mother   . Alzheimer's disease Mother   . Cancer Sister   . Diabetes Brother   . Heart disease Father   . Cancer Sister 23       breast ca  . Breast cancer Sister 69  . Diabetes Sister   . Hypertension Sister   . Cancer Sister 22       breast ca  . Breast cancer Sister 82  . Diabetes Brother   . Cancer Brother 45       prostate  . Cancer Brother 37       prostate ca     Review of Systems  Constitutional: Negative for chills, fever and weight loss.  Respiratory: Negative for cough, shortness of breath and wheezing.   Cardiovascular: Negative for chest pain and palpitations.  Gastrointestinal: Negative for abdominal pain, nausea and vomiting.  Skin: Negative for itching and rash.  Neurological:       Numbness in the feet for a few seconds in the morning      Objective: Vitals:   08/07/17 0815  BP: 104/70  Pulse: 70  Resp: 16  Temp: 99.3 F (37.4 C)  TempSrc: Oral  SpO2: 100%  Weight: 153 lb 9.6 oz (69.7 kg)    Height: 5' 5.5" (1.664 m)    Physical Exam  Constitutional: She is oriented to person, place, and time. She appears well-developed and well-nourished.  HENT:  Head: Normocephalic and atraumatic.  Eyes: Conjunctivae and EOM are normal.  Cardiovascular: Normal rate, regular rhythm and normal heart  sounds.  No murmur heard. Pulmonary/Chest: Effort normal and breath sounds normal. No stridor. No respiratory distress.  Neurological: She is alert and oriented to person, place, and time.  Skin: Skin is warm. Capillary refill takes less than 2 seconds.  Psychiatric: She has a normal mood and affect. Her behavior is normal. Judgment and thought content normal.    Assessment and Plan Alilah was seen today for a1c and cholesterol.  Diagnoses and all orders for this visit:  Type 2 diabetes mellitus with complication, unspecified whether long term insulin use (HCC) -     POCT glycosylated hemoglobin (Hb A1C) -     Lipid panel -     Comprehensive metabolic panel well controlled hemoglobin a1c is at goal Continue exercise Lipids monitored and renal function in range Discontinue  Metformin and continue to monitor sugars If sugars increase then resume 1/2 dose of metformin   Encounter for medication monitoring -     Lipid panel -     Comprehensive metabolic panel  Encounter for vitamin deficiency screening -     VITAMIN D 25 Hydroxy (Vit-D Deficiency, Fractures)  Need for hepatitis B vaccination -     Hepatitis B vaccine adult IM     Grayland Daisey A Mihira Tozzi

## 2017-08-08 LAB — VITAMIN D 25 HYDROXY (VIT D DEFICIENCY, FRACTURES): Vit D, 25-Hydroxy: 30.2 ng/mL (ref 30.0–100.0)

## 2017-12-26 ENCOUNTER — Other Ambulatory Visit: Payer: Self-pay | Admitting: Family Medicine

## 2017-12-26 DIAGNOSIS — Z1231 Encounter for screening mammogram for malignant neoplasm of breast: Secondary | ICD-10-CM

## 2018-02-09 ENCOUNTER — Ambulatory Visit
Admission: RE | Admit: 2018-02-09 | Discharge: 2018-02-09 | Disposition: A | Discharge status: Home / Self Care | Payer: Managed Care, Other (non HMO) | Source: Ambulatory Visit | Attending: Family Medicine | Admitting: Family Medicine

## 2018-02-09 DIAGNOSIS — Z1231 Encounter for screening mammogram for malignant neoplasm of breast: Secondary | ICD-10-CM

## 2018-03-04 ENCOUNTER — Encounter: Payer: Self-pay | Admitting: Family Medicine

## 2018-03-04 ENCOUNTER — Other Ambulatory Visit: Payer: Self-pay

## 2018-03-04 ENCOUNTER — Ambulatory Visit (INDEPENDENT_AMBULATORY_CARE_PROVIDER_SITE_OTHER): Payer: Managed Care, Other (non HMO) | Admitting: Family Medicine

## 2018-03-04 VITALS — BP 123/74 | HR 70 | Temp 98.7°F | Resp 14 | Ht 65.0 in | Wt 155.8 lb

## 2018-03-04 DIAGNOSIS — Z0000 Encounter for general adult medical examination without abnormal findings: Secondary | ICD-10-CM

## 2018-03-04 DIAGNOSIS — Z1329 Encounter for screening for other suspected endocrine disorder: Secondary | ICD-10-CM

## 2018-03-04 DIAGNOSIS — Z808 Family history of malignant neoplasm of other organs or systems: Secondary | ICD-10-CM

## 2018-03-04 DIAGNOSIS — Z0001 Encounter for general adult medical examination with abnormal findings: Secondary | ICD-10-CM

## 2018-03-04 DIAGNOSIS — E119 Type 2 diabetes mellitus without complications: Secondary | ICD-10-CM | POA: Diagnosis not present

## 2018-03-04 DIAGNOSIS — E785 Hyperlipidemia, unspecified: Secondary | ICD-10-CM

## 2018-03-04 LAB — POCT GLYCOSYLATED HEMOGLOBIN (HGB A1C): Hemoglobin A1C: 6.4 % — AB (ref 4.0–5.6)

## 2018-03-04 NOTE — Progress Notes (Signed)
Chief Complaint  Patient presents with  . Annual Exam    Family hx of thyroid cancer wanted to talk about if i need to have this checked. looking back on patient medical record last tsh was done 01/2017 normal.    Subjective:  Chelsea Mckay is a 58 y.o. female here for a health maintenance visit.  Patient is established pt  Family History of Thyroid Cancer Patient reports that her niece had thyroid cancer. Her sister's daughter  Maternal aunt and her daughter's all had thyroid cancer  She states that she just found out about this 2 weeks ago   Lab Results  Component Value Date   TSH 1.090 02/05/2017     Patient Active Problem List   Diagnosis Date Noted  . Newly diagnosed diabetes ([place]) 05/09/2017  . Colon polyp 04/16/2017  . Postmenopausal bleeding 02/05/2017  . Irregular intermenstrual bleeding 02/05/2017  . Family history of breast cancer in sister 01/10/2016  . Breast mass in female 12/17/2011  . AR (allergic rhinitis) 11/28/2011  . Benign hematuria 11/28/2011  . Hyperglycemia 11/28/2011  . Abnormal cervical Papanicolaou smear 04/15/2010    Past Medical History:  Diagnosis Date  . Allergy   . Anxiety   . Asthma   . Asthmatic bronchitis   . Chest pressure   . Colon polyp 04/16/2017   Done 04/10/17. 48m polyp, transverse colon. Follow up cscopy in 5 years  . Depression   . Headache(784.0)    Barometric pressure migraine headaches  . Kidney stones     Past Surgical History:  Procedure Laterality Date  . BUNIONECTOMY  1999  . CERVICAL POLYPECTOMY  2007  . COLONOSCOPY    . COLONOSCOPY W/ POLYPECTOMY    . DILATATION & CURETTAGE/HYSTEROSCOPY WITH TRUECLEAR N/A 09/24/2013   Procedure: DILATATION & CURETTAGE/HYSTEROSCOPY WITH TRUCLEAR and Endometrial Ablation;  Surgeon: NBetsy Coder MD;  [facility];  Service: Gynecology;  Laterality: N/A;  . TUBAL LIGATION  1987     Outpatient Medications Prior to Visit  Medication Sig Dispense Refill  . Blood  Glucose Monitoring Suppl (BLOOD GLUCOSE MONITOR SYSTEM) w/Device KIT Use to check blood glucose twice daily. ICD 10 code E11.9. Dispense type covered by insurance. 1 each 0  . glucose blood test strip Use to check blood glucose twice daily. ICD 10 code E11.9. Dispense type covered by insurance. 100 each 12  . Lancet Devices (LANCING DEVICE) MISC Use to check blood glucose twice daily. ICD 10 code E11.9. Dispense type covered by insurance. 1 each 1  . Lancets 30G MISC Use to check blood glucose twice daily. ICD 10 code E11.9. Dispense type covered by insurance. 100 each 11  . Olopatadine HCl 0.2 % SOLN Apply 1 drop to eye daily. 2.5 mL 0  . fexofenadine (ALLEGRA) 180 MG tablet Take 180 mg by mouth daily as needed for allergies.    . benzonatate (TESSALON) 100 MG capsule Take 1-2 capsules (100-200 mg total) by mouth 3 (three) times daily as needed. 60 capsule 0  . chlorpheniramine-HYDROcodone (TUSSIONEX PENNKINETIC ER) 10-8 MG/5ML SUER Take 5 mLs by mouth at bedtime as needed. 100 mL 0  . cholecalciferol (VITAMIN D) 1000 units tablet Take 1,000 Units by mouth daily.    . fluticasone (FLONASE) 50 MCG/ACT nasal spray Place 2 sprays into both nostrils daily. 16 g 6  . ibuprofen (ADVIL,MOTRIN) 200 MG tablet Take 200 mg by mouth every 6 (six) hours as needed.    . metFORMIN (GLUCOPHAGE) 500 MG tablet Take  1 tablet (500 mg total) by mouth daily with breakfast. (Patient taking differently: Take 250 mg by mouth daily with breakfast. ) 90 tablet 1  . thiamine (VITAMIN B-1) 100 MG tablet Take 100 mg by mouth daily.     No facility-administered medications prior to visit.     Allergies  Allergen Reactions  . Adhesive [Tape] Rash  . Ciprofloxacin Nausea And Vomiting and Rash     Family History  Problem Relation Age of Onset  . Diabetes Mother   . Alzheimer's disease Mother   . Cancer Sister   . Diabetes Brother   . Heart disease Father   . Cancer Sister 32       breast ca  . Breast cancer Sister  62  . Diabetes Sister   . Hypertension Sister   . Cancer Sister 29       breast ca  . Breast cancer Sister 63  . Diabetes Brother   . Cancer Brother 39       prostate  . Cancer Brother 64       prostate ca     Health Habits: Dental Exam: up to date Eye Exam: up to date Exercise: 5-7  times/week on average Current exercise activities: walking/running Diet: balanced   Wt Readings from Last 3 Encounters:  03/04/18 155 lb 12.8 oz (70.7 kg)  08/07/17 153 lb 9.6 oz (69.7 kg)  05/09/17 164 lb (74.4 kg)    Social History   Socioeconomic History  . Marital status: Widowed    Spouse name: Raquel Sarna  . Number of children: 3  . Years of education: 3  . Highest education level: Not on file  Occupational History  . Occupation: Production designer, theatre/television/film    Employer: [employer]  Social Needs  . Financial resource strain: Not on file  . Food insecurity:    Worry: Not on file    Inability: Not on file  . Transportation needs:    Medical: Not on file    Non-medical: Not on file  Tobacco Use  . Smoking status: Never Smoker  . Smokeless tobacco: Never Used  Substance and Sexual Activity  . Alcohol use: Yes    Comment: rare  . Drug use: No  . Sexual activity: Not Currently    Partners: Male  Lifestyle  . Physical activity:    Days per week: Not on file    Minutes per session: Not on file  . Stress: Not on file  Relationships  . Social connections:    Talks on phone: Not on file    Gets together: Not on file    Attends religious service: Not on file    Active member of club or organization: Not on file    Attends meetings of clubs or organizations: Not on file    Relationship status: Not on file  . Intimate partner violence:    Fear of current or ex partner: Not on file    Emotionally abused: Not on file    Physically abused: Not on file    Forced sexual activity: Not on file  Other Topics Concern  . Not on file  Social History Narrative   Lives alone.  Husband died unexpectedly of a heart attack at work 11/15/2011. Good support from pastor and daughters.   Social History   Substance and Sexual Activity  Alcohol Use Yes   Comment: rare   Social History   Tobacco Use  Smoking Status Never Smoker  Smokeless Tobacco Never Used  Social History   Substance and Sexual Activity  Drug Use No    GYN: Sexual Health Menstrual status: regular menses LMP: No LMP recorded. Patient is perimenopausal. Last pap smear: see HM section History of abnormal pap smears:  Sexually active: with female partner   Health Maintenance: See under health Maintenance activity for review of completion dates as well. Immunization History  Administered Date(s) Administered  . Hepatitis B, adult 08/07/2017  . Influenza Split 10/29/2011  . Influenza-Unspecified 11/03/2014, 11/03/2015, 10/31/2016  . Pneumococcal Conjugate-13 11/03/2014  . Tdap 01/10/2016  . Zoster Recombinat (Shingrix) 10/31/2016      Depression Screen-PHQ2/9 Depression screen [facility] 2/9 03/04/2018 08/07/2017 05/09/2017 05/07/2017 03/26/2017  Decreased Interest 0 0 0 0 0  Down, Depressed, Hopeless 1 0 0 0 0  PHQ - 2 Score 1 0 0 0 0  Altered sleeping - - - - -  Tired, decreased energy - - - - -  Change in appetite - - - - -  Feeling bad or failure about yourself  - - - - -  Trouble concentrating - - - - -  Moving slowly or fidgety/restless - - - - -  Suicidal thoughts - - - - -  PHQ-9 Score - - - - -  Difficult doing work/chores - - - - -       Depression Severity and Treatment Recommendations:  0-4= None  5-9= Mild / Treatment: Support, educate to call if worse; return in one month  10-14= Moderate / Treatment: Support, watchful waiting; Antidepressant or Psycotherapy  15-19= Moderately severe / Treatment: Antidepressant OR Psychotherapy  >= 20 = Major depression, severe / Antidepressant AND Psychotherapy    Review of Systems   ROS  See HPI for ROS as well.   Review of  Systems  Constitutional: Negative for activity change, appetite change, chills and fever.  HENT: Negative for congestion, nosebleeds, trouble swallowing and voice change.   Respiratory: Negative for cough, shortness of breath and wheezing.   Gastrointestinal: Negative for diarrhea, nausea and vomiting.  Genitourinary: Negative for difficulty urinating, dysuria, flank pain and hematuria.  Musculoskeletal: Negative for back pain, joint swelling and neck pain.  Neurological: Negative for dizziness, speech difficulty, light-headedness and numbness.  See HPI. All other review of systems negative.   Objective:   Vitals:   03/04/18 0929  BP: 123/74  Pulse: 70  Resp: 14  Temp: 98.7 F (37.1 C)  TempSrc: Oral  SpO2: 97%  Weight: 155 lb 12.8 oz (70.7 kg)  Height: '5\' 5"'$  (1.651 m)    Body mass index is 25.93 kg/m.  Physical Exam  Physical Exam  Constitutional: Oriented to person, place, and time. Appears well-developed and well-nourished.  HENT:  Head: Normocephalic and atraumatic.  Eyes: Conjunctivae and EOM are normal.  Cardiovascular: Normal rate, regular rhythm, normal heart sounds and intact distal pulses.  No murmur heard. Pulmonary/Chest: Effort normal and breath sounds normal. No stridor. No respiratory distress. Has no wheezes.  Breast exam:   Abdomen: non-distended, normoactive bs, soft, nontender Neurological: Is alert and oriented to person, place, and time.  Skin: Skin is warm. Capillary refill takes less than 2 seconds.  Psychiatric: Has a normal mood and affect. Behavior is normal. Judgment and thought content normal.     Assessment/Plan:   Patient was seen for a health maintenance exam.  Counseled the patient on health maintenance issues. Reviewed her health mainteance schedule and ordered appropriate tests (see orders.) Counseled on regular exercise and weight management. Recommend regular  eye exams and dental cleaning.   The following issues were addressed  today for health maintenance:   Iridian was seen today for annual exam.  Diagnoses and all orders for this visit:  Encounter for health maintenance examination in adult- Women's Health Maintenance Plan Advised monthly breast exam and annual mammogram Advised dental exam every six months Discussed stress management Discussed pap smear screening guidelines  Well controlled type 2 diabetes mellitus ([place])-  Discussed diabetes goal  -     POCT glycosylated hemoglobin (Hb A1C) -     Comprehensive metabolic panel -     Cancel: Microalbumin/Creatinine Ratio, Urine  Dyslipidemia, goal LDL below 130-  Discussed cholesterol goal -     Lipid Panel  Screening for thyroid disorder -     TSH -     US Soft Tissue Head/Neck; Future  Family history of thyroid cancer -     Cancel: Ambulatory referral to Endocrinology -     US Soft Tissue Head/Neck; Future    No follow-ups on file.    Body mass index is 25.93 kg/m.:  Discussed the patient's BMI with patient. The BMI body mass index is 25.93 kg/m.     No future appointments.  Patient Instructions       If you have lab work done today you will be contacted with your lab results within the next 2 weeks.  If you have not heard from [place] then please contact us. The fastest way to get your results is to register for My Chart.   IF you received an x-ray today, you will receive an invoice from [facility]. Please contact [facility] at [phone] with questions or concerns regarding your invoice.   IF you received labwork today, you will receive an invoice from [facility]. Please contact [facility] at [phone] with questions or concerns regarding your invoice.   Our billing staff will not be able to assist you with questions regarding bills from these companies.  You will be contacted with the lab results as soon as they are available. The fastest way to get your results is to activate your My Chart account. Instructions  are located on the last page of this paperwork. If you have not heard from [place] regarding the results in 2 weeks, please contact this office.

## 2018-03-04 NOTE — Patient Instructions (Addendum)
   If you have lab work done today you will be contacted with your lab results within the next 2 weeks.  If you have not heard from us then please contact us. The fastest way to get your results is to register for My Chart.   IF you received an x-ray today, you will receive an invoice from [facility]. Please contact [facility] at [phone] with questions or concerns regarding your invoice.   IF you received labwork today, you will receive an invoice from [facility]. Please contact [facility] at [phone] with questions or concerns regarding your invoice.   Our billing staff will not be able to assist you with questions regarding bills from these companies.  You will be contacted with the lab results as soon as they are available. The fastest way to get your results is to activate your My Chart account. Instructions are located on the last page of this paperwork. If you have not heard from us regarding the results in 2 weeks, please contact this office.    Health Maintenance, Female Adopting a healthy lifestyle and getting preventive care can go a long way to promote health and wellness. Talk with your health care provider about what schedule of regular examinations is right for you. This is a good chance for you to check in with your provider about disease prevention and staying healthy. In between checkups, there are plenty of things you can do on your own. Experts have done a lot of research about which lifestyle changes and preventive measures are most likely to keep you healthy. Ask your health care provider for more information. Weight and diet Eat a healthy diet  Be sure to include plenty of vegetables, fruits, low-fat dairy products, and lean protein.  Do not eat a lot of foods high in solid fats, added sugars, or salt.  Get regular exercise. This is one of the most important things you can do for your health. ? Most adults should exercise for at least 150  minutes each week. The exercise should increase your heart rate and make you sweat (moderate-intensity exercise). ? Most adults should also do strengthening exercises at least twice a week. This is in addition to the moderate-intensity exercise. Maintain a healthy weight  Body mass index (BMI) is a measurement that can be used to identify possible weight problems. It estimates body fat based on height and weight. Your health care provider can help determine your BMI and help you achieve or maintain a healthy weight.  For females 20 years of age and older: ? A BMI below 18.5 is considered underweight. ? A BMI of 18.5 to 24.9 is normal. ? A BMI of 25 to 29.9 is considered overweight. ? A BMI of 30 and above is considered obese. Watch levels of cholesterol and blood lipids  You should start having your blood tested for lipids and cholesterol at 58 years of age, then have this test every 5 years.  You may need to have your cholesterol levels checked more often if: ? Your lipid or cholesterol levels are high. ? You are older than 58 years of age. ? You are at high risk for heart disease. Cancer screening Lung Cancer  Lung cancer screening is recommended for adults 55-80 years old who are at high risk for lung cancer because of a history of smoking.  A yearly low-dose CT scan of the lungs is recommended for people who: ? Currently smoke. ? Have quit within the past 15   years. ? Have at least a 30-pack-year history of smoking. A pack year is smoking an average of one pack of cigarettes a day for 1 year.  Yearly screening should continue until it has been 15 years since you quit.  Yearly screening should stop if you develop a health problem that would prevent you from having lung cancer treatment. Breast Cancer  Practice breast self-awareness. This means understanding how your breasts normally appear and feel.  It also means doing regular breast self-exams. Let your health care provider  know about any changes, no matter how small.  If you are in your 20s or 30s, you should have a clinical breast exam (CBE) by a health care provider every 1-3 years as part of a regular health exam.  If you are 40 or older, have a CBE every year. Also consider having a breast X-ray (mammogram) every year.  If you have a family history of breast cancer, talk to your health care provider about genetic screening.  If you are at high risk for breast cancer, talk to your health care provider about having an MRI and a mammogram every year.  Breast cancer gene (BRCA) assessment is recommended for women who have family members with BRCA-related cancers. BRCA-related cancers include: ? Breast. ? Ovarian. ? Tubal. ? Peritoneal cancers.  Results of the assessment will determine the need for genetic counseling and BRCA1 and BRCA2 testing. Cervical Cancer Your health care provider may recommend that you be screened regularly for cancer of the pelvic organs (ovaries, uterus, and vagina). This screening involves a pelvic examination, including checking for microscopic changes to the surface of your cervix (Pap test). You may be encouraged to have this screening done every 3 years, beginning at age 21.  For women ages 30-65, health care providers may recommend pelvic exams and Pap testing every 3 years, or they may recommend the Pap and pelvic exam, combined with testing for human papilloma virus (HPV), every 5 years. Some types of HPV increase your risk of cervical cancer. Testing for HPV may also be done on women of any age with unclear Pap test results.  Other health care providers may not recommend any screening for nonpregnant women who are considered low risk for pelvic cancer and who do not have symptoms. Ask your health care provider if a screening pelvic exam is right for you.  If you have had past treatment for cervical cancer or a condition that could lead to cancer, you need Pap tests and  screening for cancer for at least 20 years after your treatment. If Pap tests have been discontinued, your risk factors (such as having a new sexual partner) need to be reassessed to determine if screening should resume. Some women have medical problems that increase the chance of getting cervical cancer. In these cases, your health care provider may recommend more frequent screening and Pap tests. Colorectal Cancer  This type of cancer can be detected and often prevented.  Routine colorectal cancer screening usually begins at 58 years of age and continues through 58 years of age.  Your health care provider may recommend screening at an earlier age if you have risk factors for colon cancer.  Your health care provider may also recommend using home test kits to check for hidden blood in the stool.  A small camera at the end of a tube can be used to examine your colon directly (sigmoidoscopy or colonoscopy). This is done to check for the earliest forms of colorectal cancer.    Routine screening usually begins at age 50.  Direct examination of the colon should be repeated every 5-10 years through 58 years of age. However, you may need to be screened more often if early forms of precancerous polyps or small growths are found. Skin Cancer  Check your skin from head to toe regularly.  Tell your health care provider about any new moles or changes in moles, especially if there is a change in a mole's shape or color.  Also tell your health care provider if you have a mole that is larger than the size of a pencil eraser.  Always use sunscreen. Apply sunscreen liberally and repeatedly throughout the day.  Protect yourself by wearing long sleeves, pants, a wide-brimmed hat, and sunglasses whenever you are outside. Heart disease, diabetes, and high blood pressure  High blood pressure causes heart disease and increases the risk of stroke. High blood pressure is more likely to develop in: ? People who  have blood pressure in the high end of the normal range (130-139/85-89 mm Hg). ? People who are overweight or obese. ? People who are African American.  If you are 18-39 years of age, have your blood pressure checked every 3-5 years. If you are 40 years of age or older, have your blood pressure checked every year. You should have your blood pressure measured twice-once when you are at a hospital or clinic, and once when you are not at a hospital or clinic. Record the average of the two measurements. To check your blood pressure when you are not at a hospital or clinic, you can use: ? An automated blood pressure machine at a pharmacy. ? A home blood pressure monitor.  If you are between 55 years and 79 years old, ask your health care provider if you should take aspirin to prevent strokes.  Have regular diabetes screenings. This involves taking a blood sample to check your fasting blood sugar level. ? If you are at a normal weight and have a low risk for diabetes, have this test once every three years after 58 years of age. ? If you are overweight and have a high risk for diabetes, consider being tested at a younger age or more often. Preventing infection Hepatitis B  If you have a higher risk for hepatitis B, you should be screened for this virus. You are considered at high risk for hepatitis B if: ? You were born in a country where hepatitis B is common. Ask your health care provider which countries are considered high risk. ? Your parents were born in a high-risk country, and you have not been immunized against hepatitis B (hepatitis B vaccine). ? You have HIV or AIDS. ? You use needles to inject street drugs. ? You live with someone who has hepatitis B. ? You have had sex with someone who has hepatitis B. ? You get hemodialysis treatment. ? You take certain medicines for conditions, including cancer, organ transplantation, and autoimmune conditions. Hepatitis C  Blood testing is  recommended for: ? Everyone born from 1945 through 1965. ? Anyone with known risk factors for hepatitis C. Sexually transmitted infections (STIs)  You should be screened for sexually transmitted infections (STIs) including gonorrhea and chlamydia if: ? You are sexually active and are younger than 58 years of age. ? You are older than 58 years of age and your health care provider tells you that you are at risk for this type of infection. ? Your sexual activity has changed since you   were last screened and you are at an increased risk for chlamydia or gonorrhea. Ask your health care provider if you are at risk.  If you do not have HIV, but are at risk, it may be recommended that you take a prescription medicine daily to prevent HIV infection. This is called pre-exposure prophylaxis (PrEP). You are considered at risk if: ? You are sexually active and do not regularly use condoms or know the HIV status of your partner(s). ? You take drugs by injection. ? You are sexually active with a partner who has HIV. Talk with your health care provider about whether you are at high risk of being infected with HIV. If you choose to begin PrEP, you should first be tested for HIV. You should then be tested every 3 months for as long as you are taking PrEP. Pregnancy  If you are premenopausal and you may become pregnant, ask your health care provider about preconception counseling.  If you may become pregnant, take 400 to 800 micrograms (mcg) of folic acid every day.  If you want to prevent pregnancy, talk to your health care provider about birth control (contraception). Osteoporosis and menopause  Osteoporosis is a disease in which the bones lose minerals and strength with aging. This can result in serious bone fractures. Your risk for osteoporosis can be identified using a bone density scan.  If you are 65 years of age or older, or if you are at risk for osteoporosis and fractures, ask your health care  provider if you should be screened.  Ask your health care provider whether you should take a calcium or vitamin D supplement to lower your risk for osteoporosis.  Menopause may have certain physical symptoms and risks.  Hormone replacement therapy may reduce some of these symptoms and risks. Talk to your health care provider about whether hormone replacement therapy is right for you. Follow these instructions at home:  Schedule regular health, dental, and eye exams.  Stay current with your immunizations.  Do not use any tobacco products including cigarettes, chewing tobacco, or electronic cigarettes.  If you are pregnant, do not drink alcohol.  If you are breastfeeding, limit how much and how often you drink alcohol.  Limit alcohol intake to no more than 1 drink per day for nonpregnant women. One drink equals 12 ounces of beer, 5 ounces of wine, or 1 ounces of hard liquor.  Do not use street drugs.  Do not share needles.  Ask your health care provider for help if you need support or information about quitting drugs.  Tell your health care provider if you often feel depressed.  Tell your health care provider if you have ever been abused or do not feel safe at home. This information is not intended to replace advice given to you by your health care provider. Make sure you discuss any questions you have with your health care provider. Document Released: 07/16/2010 Document Revised: 06/08/2015 Document Reviewed: 10/04/2014 Elsevier Interactive Patient Education  2019 [employer]  

## 2018-03-05 LAB — COMPREHENSIVE METABOLIC PANEL
ALT: 15 IU/L (ref 0–32)
AST: 16 IU/L (ref 0–40)
Albumin/Globulin Ratio: 1.9 (ref 1.2–2.2)
Albumin: 4.8 g/dL (ref 3.8–4.9)
Alkaline Phosphatase: 83 IU/L (ref 39–117)
BILIRUBIN TOTAL: 1.1 mg/dL (ref 0.0–1.2)
BUN/Creatinine Ratio: 18 (ref 9–23)
BUN: 17 mg/dL (ref 6–24)
CHLORIDE: 101 mmol/L (ref 96–106)
CO2: 22 mmol/L (ref 20–29)
Calcium: 9.9 mg/dL (ref 8.7–10.2)
Creatinine, Ser: 0.93 mg/dL (ref 0.57–1.00)
GFR calc Af Amer: 79 mL/min/{1.73_m2} (ref >59)
GFR calc non Af Amer: 68 mL/min/{1.73_m2} (ref >59)
Globulin, Total: 2.5 g/dL (ref 1.5–4.5)
Glucose: 122 mg/dL — ABNORMAL HIGH (ref 65–99)
Potassium: 4.3 mmol/L (ref 3.5–5.2)
Sodium: 141 mmol/L (ref 134–144)
Total Protein: 7.3 g/dL (ref 6.0–8.5)

## 2018-03-05 LAB — LIPID PANEL
Chol/HDL Ratio: 3.3 ratio (ref 0.0–4.4)
Cholesterol, Total: 195 mg/dL (ref 100–199)
HDL: 59 mg/dL (ref >39)
LDL Calculated: 119 mg/dL — ABNORMAL HIGH (ref 0–99)
Triglycerides: 87 mg/dL (ref 0–149)
VLDL CHOLESTEROL CAL: 17 mg/dL (ref 5–40)

## 2018-03-05 LAB — TSH: TSH: 0.754 u[IU]/mL (ref 0.450–4.500)

## 2018-03-12 ENCOUNTER — Other Ambulatory Visit: Payer: Self-pay | Admitting: Family Medicine

## 2018-03-12 DIAGNOSIS — Z1329 Encounter for screening for other suspected endocrine disorder: Secondary | ICD-10-CM

## 2018-03-12 DIAGNOSIS — Z808 Family history of malignant neoplasm of other organs or systems: Secondary | ICD-10-CM

## 2018-03-19 ENCOUNTER — Ambulatory Visit
Admission: RE | Admit: 2018-03-19 | Discharge: 2018-03-19 | Disposition: A | Discharge status: Home / Self Care | Payer: Managed Care, Other (non HMO) | Source: Ambulatory Visit | Attending: Family Medicine | Admitting: Family Medicine

## 2018-03-19 DIAGNOSIS — Z808 Family history of malignant neoplasm of other organs or systems: Secondary | ICD-10-CM

## 2018-03-19 DIAGNOSIS — Z1329 Encounter for screening for other suspected endocrine disorder: Secondary | ICD-10-CM

## 2018-03-22 ENCOUNTER — Encounter: Payer: Self-pay | Admitting: Family Medicine

## 2018-04-04 ENCOUNTER — Encounter: Payer: Self-pay | Admitting: Family Medicine

## 2018-04-07 ENCOUNTER — Other Ambulatory Visit: Payer: Self-pay | Admitting: Urgent Care

## 2018-06-01 IMAGING — DX DG RIBS W/ CHEST 3+V*L*
5 series · 5 of 5 positions shown · non-contrast
Comparison: 06/15/2015

CLINICAL DATA: Left chest and rib pain.

EXAM:
LEFT RIBS AND CHEST - 3+ VIEW

[chest pa]
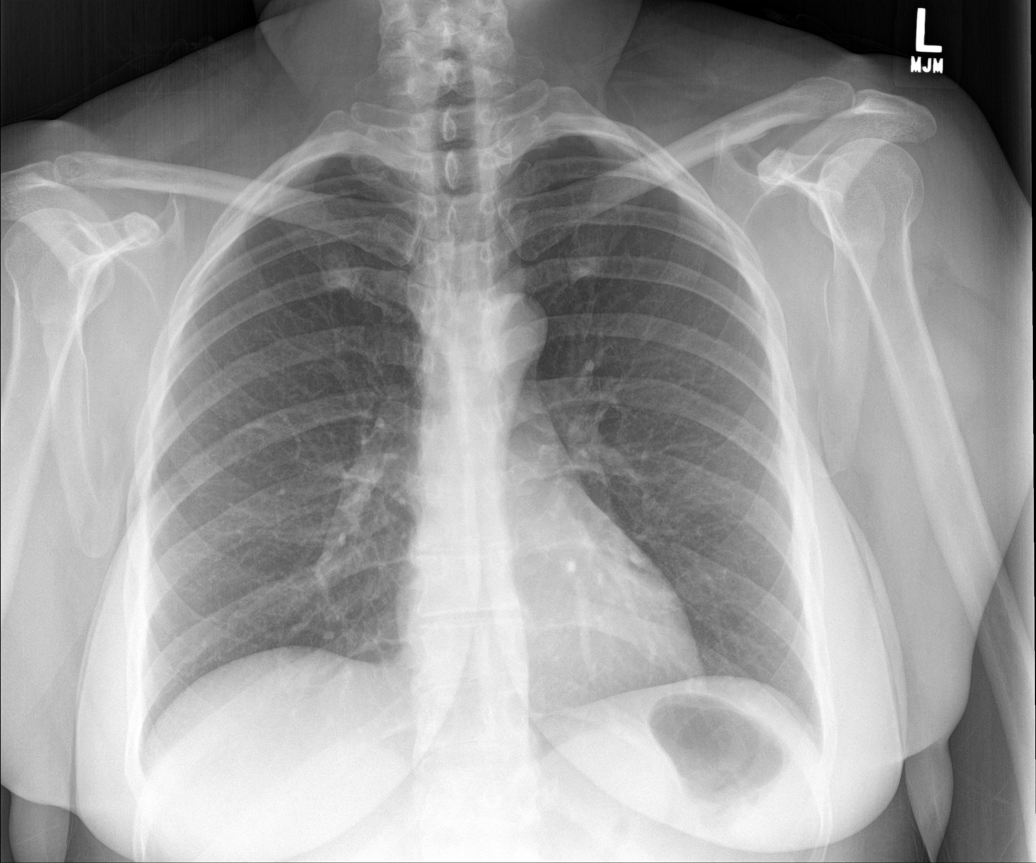

[rib obl (1 of 2)]
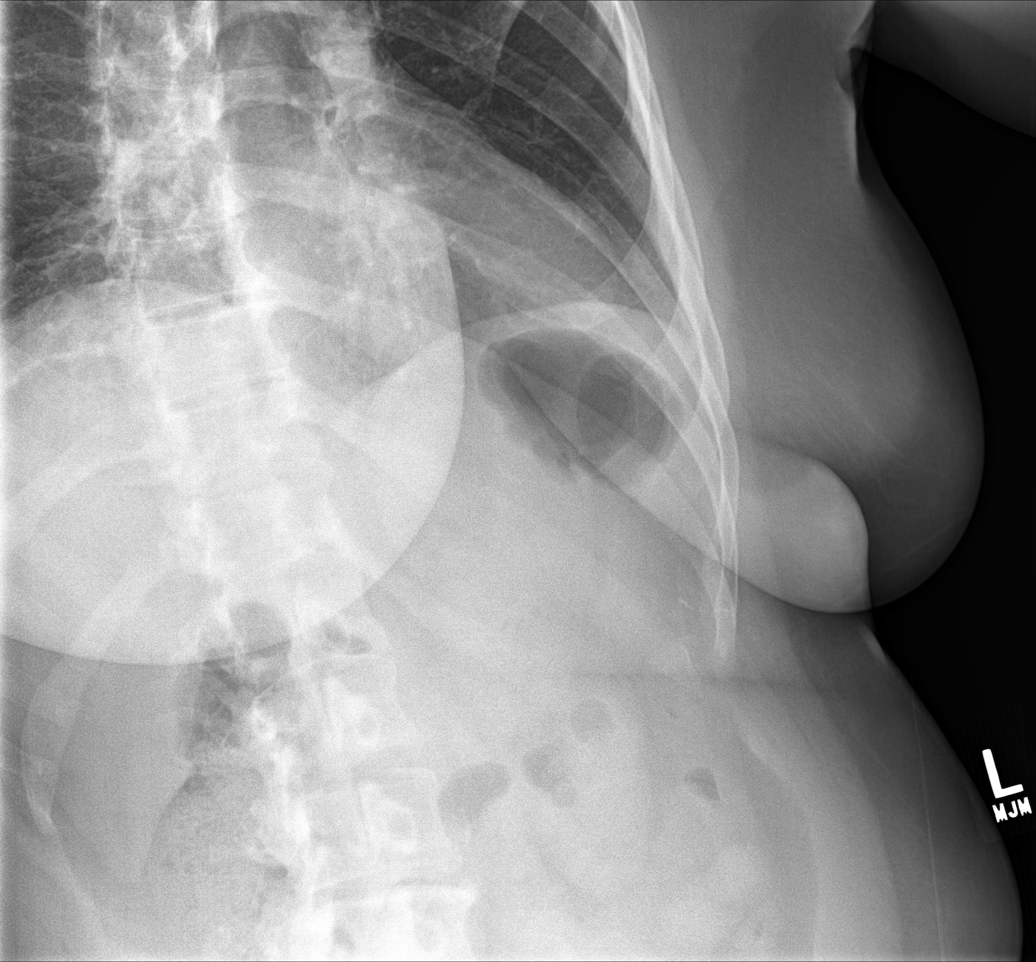

[rib obl (2 of 2)]
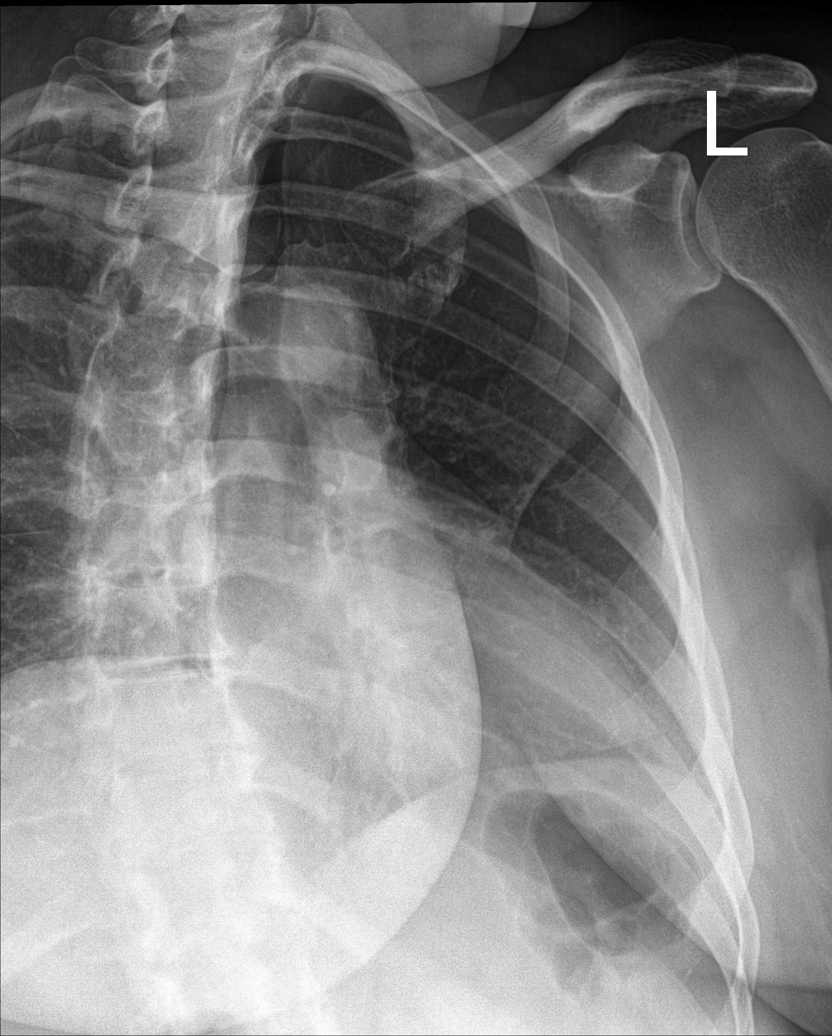

[rib pa (1 of 2)]
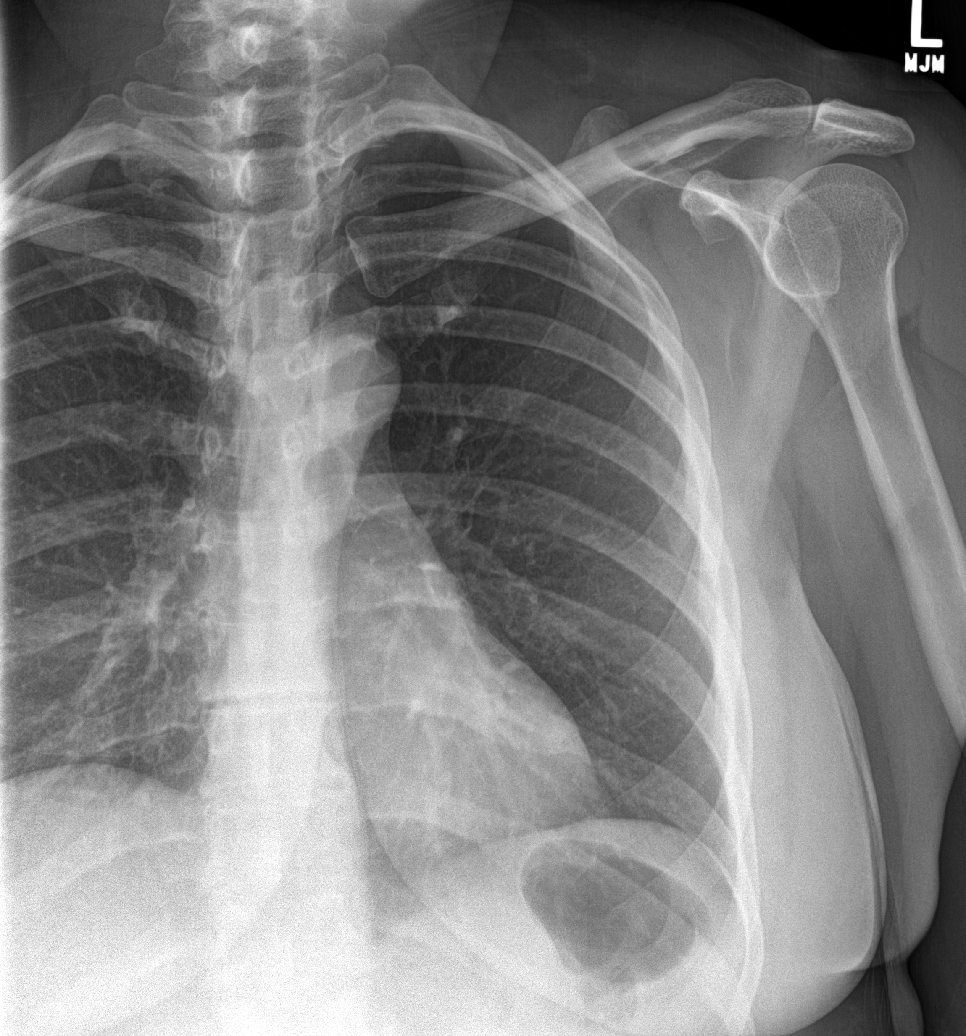

[rib pa (2 of 2)]
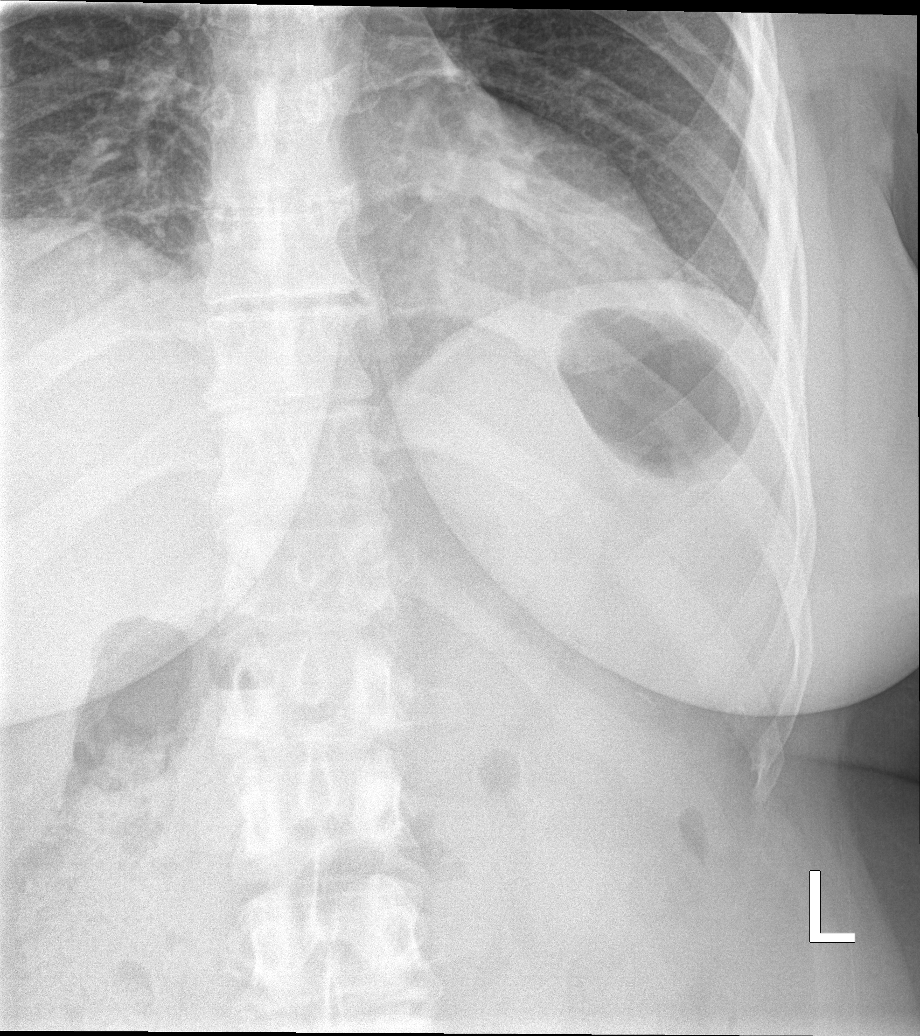

[5 of 5 positions shown; findings below may reference images not displayed]

FINDINGS: No fracture or other bone lesions are seen involving the ribs. There
is no evidence of pneumothorax or pleural effusion. Both lungs are
clear. Heart size and mediastinal contours are within normal limits.
IMPRESSION: Negative.

## 2018-06-21 ENCOUNTER — Other Ambulatory Visit: Payer: Self-pay | Admitting: Family Medicine

## 2018-06-21 NOTE — Telephone Encounter (Signed)
Requested medication (s) are due for refill today: yes  Requested medication (s) are on the active medication list: no  Last refill:  02/10/17 rx ended 02/25/17  Future visit scheduled: yes  Notes to clinic:  Medication not on active med list- Rx ended 02/25/17   Requested Prescriptions  Pending Prescriptions Disp Refills   medroxyPROGESTERone (PROVERA) 10 MG tablet [Pharmacy Med Name: MEDROXYPROGESTERONE 10MG  TABLETS] 30 tablet 2    Sig: TAKE 1 TABLET(10 MG) BY MOUTH DAILY FOR 6 DAYS     OB/GYN:  Progestins Passed - 06/21/2018 10:10 AM      Passed - Valid encounter within last 12 months    Recent Outpatient Visits          3 months ago Encounter for health maintenance examination in adult   [facility] at [facility], Zoe A, MD   10 months ago Type 2 diabetes mellitus with complication, unspecified whether long term insulin use ([place])   Primary Care at [facility], Zoe A, MD   1 year ago Well controlled type 2 diabetes mellitus ([place])   Primary Care at [facility], Arlie Solomons, MD   1 year ago Newly diagnosed diabetes [facility])   Primary Care at [place], [place]   1 year ago Viral URI with cough   Primary Care at [place], [place]      Future Appointments            In 2 months Forrest Moron, MD Primary Care at [place], [facility]

## 2018-07-17 ENCOUNTER — Encounter: Payer: Self-pay | Admitting: Family Medicine

## 2018-09-03 ENCOUNTER — Ambulatory Visit: Payer: Managed Care, Other (non HMO) | Admitting: Family Medicine

## 2018-09-04 ENCOUNTER — Other Ambulatory Visit: Payer: Self-pay

## 2018-09-04 ENCOUNTER — Ambulatory Visit: Payer: Managed Care, Other (non HMO) | Admitting: Family Medicine

## 2018-09-04 ENCOUNTER — Encounter: Payer: Self-pay | Admitting: Family Medicine

## 2018-09-04 VITALS — BP 127/79 | HR 68 | Temp 98.0°F | Ht 65.0 in | Wt 159.0 lb

## 2018-09-04 DIAGNOSIS — E119 Type 2 diabetes mellitus without complications: Secondary | ICD-10-CM | POA: Diagnosis not present

## 2018-09-04 DIAGNOSIS — E663 Overweight: Secondary | ICD-10-CM

## 2018-09-04 DIAGNOSIS — E2839 Other primary ovarian failure: Secondary | ICD-10-CM

## 2018-09-04 DIAGNOSIS — E785 Hyperlipidemia, unspecified: Secondary | ICD-10-CM

## 2018-09-04 DIAGNOSIS — Z5181 Encounter for therapeutic drug level monitoring: Secondary | ICD-10-CM

## 2018-09-04 LAB — POCT GLYCOSYLATED HEMOGLOBIN (HGB A1C): Hemoglobin A1C: 6.2 % — AB (ref 4.0–5.6)

## 2018-09-04 MED ORDER — ATORVASTATIN CALCIUM 10 MG PO TABS: 10.0000 mg | ORAL_TABLET | ORAL | 3 refills | Status: DC

## 2018-09-04 NOTE — Progress Notes (Signed)
Established Patient Office Visit  Subjective:  Patient ID: Chelsea Mckay, female    DOB: July 20, 1960  Age: 58 y.o. MRN: [id number]  CC:  Chief Complaint  Patient presents with  . Diabetes    42mf/u     HPI Chelsea Mckay for   Diabetes Mellitus: Patient presents for follow up of diabetes. Symptoms: none. Symptoms have stabilized. Patient denies foot ulcerations, hyperglycemia, hypoglycemia , increase appetite, nausea and paresthesia of the feet.  Evaluation to date has been included: hemoglobin A1C.  Home sugars: BGs consistently in an acceptable range. Treatment to date: more intensive attention to diet which has been effective.  Lab Results  Component Value Date   HGBA1C 6.4 (A) 03/04/2018   Dyslipidemia: Patient presents for evaluation of lipids.  Compliance with treatment thus far has been good.  A repeat fasting lipid profile was done.  The patient does not use medications that may worsen dyslipidemias (corticosteroids, progestins, anabolic steroids, diuretics, beta-blockers, amiodarone, cyclosporine, olanzapine). The patient exercises weekly.  The patient is not known to have coexisting coronary artery disease.   Dysthmia She reports that her family has gone through some emotional stress from loss of family members and covid. She has been talking to her sister to help her cope. She has been also a support for other. Lately she has been feeling sad and does not know why.   She reports that years ago she had a bout of depression which she describes as not being able to pick herself back up. She feels like the COVID is also keeping her down because she cannot gather.  No suicidal thoughts [facility] 09/04/2018 03/04/2018 08/07/2017 05/09/2017 05/07/2017  Decreased Interest 0 0 0 0 0  Down, Depressed, Hopeless 0 1 0 0 0  PHQ - 2 Score 0 1 0 0 0  Altered sleeping - - - - -  Tired, decreased energy - - - - -  Change in appetite - - - - -  Feeling bad or failure  about yourself  - - - - -  Trouble concentrating - - - - -  Moving slowly or fidgety/restless - - - - -  Suicidal thoughts - - - - -  PHQ-9 Score - - - - -  Difficult doing work/chores - - - - -      Past Medical History:  Diagnosis Date  . Allergy   . Anxiety   . Asthma   . Asthmatic bronchitis   . Chest pressure   . Colon polyp 04/16/2017   Done 04/10/17. 44mpolyp, transverse colon. Follow up cscopy in 5 years  . Depression   . Headache(784.0)    Barometric pressure migraine headaches  . Kidney stones     Past Surgical History:  Procedure Laterality Date  . BUNIONECTOMY  1999  . CERVICAL POLYPECTOMY  2007  . COLONOSCOPY    . COLONOSCOPY W/ POLYPECTOMY    . DILATATION & CURETTAGE/HYSTEROSCOPY WITH TRUECLEAR N/A 09/24/2013   Procedure: DILATATION & CURETTAGE/HYSTEROSCOPY WITH TRUCLEAR and Endometrial Ablation;  Surgeon: NaBetsy CoderMD;  [facility];  Service: Gynecology;  Laterality: N/A;  . TUBAL LIGATION  1987    Family History  Problem Relation Age of Onset  . Diabetes Mother   . Alzheimer's disease Mother   . Cancer Sister   . Diabetes Brother   . Heart disease Father   . Cancer Sister [id number]     breast ca  .  Breast cancer Sister 83  . Diabetes Sister   . Hypertension Sister   . Cancer Sister 27       breast ca  . Breast cancer Sister 72  . Diabetes Brother   . Cancer Brother 74       prostate  . Cancer Brother 50       prostate ca    Social History   Socioeconomic History  . Marital status: Widowed    Spouse name: Raquel Sarna  . Number of children: 3  . Years of education: 20  . Highest education level: Not on file  Occupational History  . Occupation: Production designer, theatre/television/film    Employer: [employer]  Social Needs  . Financial resource strain: Not on file  . Food insecurity    Worry: Not on file    Inability: Not on file  . Transportation needs    Medical: Not on file    Non-medical: Not on file  Tobacco Use  . Smoking status:  Never Smoker  . Smokeless tobacco: Never Used  Substance and Sexual Activity  . Alcohol use: Yes    Comment: rare  . Drug use: No  . Sexual activity: Not Currently    Partners: Male  Lifestyle  . Physical activity    Days per week: Not on file    Minutes per session: Not on file  . Stress: Not on file  Relationships  . Social Herbalist on phone: Not on file    Gets together: Not on file    Attends religious service: Not on file    Active member of club or organization: Not on file    Attends meetings of clubs or organizations: Not on file    Relationship status: Not on file  . Intimate partner violence    Fear of current or ex partner: Not on file    Emotionally abused: Not on file    Physically abused: Not on file    Forced sexual activity: Not on file  Other Topics Concern  . Not on file  Social History Narrative   Lives alone. Husband died unexpectedly of a heart attack at work 11/15/2011. Good support from pastor and daughters.    Outpatient Medications Prior to Visit  Medication Sig Dispense Refill  . Blood Glucose Monitoring Suppl (BLOOD GLUCOSE MONITOR SYSTEM) w/Device KIT Use to check blood glucose twice daily. ICD 10 code E11.9. Dispense type covered by insurance. 1 each 0  . fexofenadine (ALLEGRA) 180 MG tablet Take 180 mg by mouth daily as needed for allergies.    Marland Kitchen glucose blood test strip Use to check blood glucose twice daily. ICD 10 code E11.9. Dispense type covered by insurance. 100 each 12  . Lancet Devices (LANCING DEVICE) MISC Use to check blood glucose twice daily. ICD 10 code E11.9. Dispense type covered by insurance. 1 each 1  . Lancets 30G MISC Use to check blood glucose twice daily. ICD 10 code E11.9. Dispense type covered by insurance. 100 each 11  . medroxyPROGESTERone (PROVERA) 10 MG tablet TAKE 1 TABLET(10 MG) BY MOUTH DAILY FOR 6 DAYS 30 tablet 2  . Olopatadine HCl 0.2 % SOLN Apply 1 drop to eye daily. 2.5 mL 0   No  facility-administered medications prior to visit.     Allergies  Allergen Reactions  . Adhesive [Tape] Rash  . Ciprofloxacin Nausea And Vomiting and Rash    ROS Review of Systems  Review of Systems  Constitutional: Negative for  activity change, appetite change, chills and fever.  HENT: Negative for congestion, nosebleeds, trouble swallowing and voice change.   Respiratory: Negative for cough, shortness of breath and wheezing.   Gastrointestinal: Negative for diarrhea, nausea and vomiting.  Genitourinary: Negative for difficulty urinating, dysuria, flank pain and hematuria.  Musculoskeletal: Negative for back pain, joint swelling and neck pain.  Neurological: Negative for dizziness, speech difficulty, light-headedness and numbness.  See HPI. All other review of systems negative.     Objective:    Physical Exam  BP 127/79 (BP Location: Right Arm, Patient Position: Sitting, Cuff Size: Normal)   Pulse 68   Temp 98 F (36.7 C) (Oral)   Ht '5\' 5"'$  (1.651 m)   Wt 159 lb (72.1 kg)   SpO2 99%   BMI 26.46 kg/m  Wt Readings from Last 3 Encounters:  09/04/18 159 lb (72.1 kg)  03/04/18 155 lb 12.8 oz (70.7 kg)  08/07/17 153 lb 9.6 oz (69.7 kg)   Physical Exam  Constitutional: Oriented to person, place, and time. Appears well-developed and well-nourished.  HENT:  Head: Normocephalic and atraumatic.  Eyes: Conjunctivae and EOM are normal.  Cardiovascular: Normal rate, regular rhythm, normal heart sounds and intact distal pulses.  No murmur heard. Pulmonary/Chest: Effort normal and breath sounds normal. No stridor. No respiratory distress. Has no wheezes.  Neurological: Is alert and oriented to person, place, and time.  Skin: Skin is warm. Capillary refill takes less than 2 seconds.  Psychiatric: Has a normal mood and affect. Behavior is normal. Judgment and thought content normal.    Health Maintenance Due  Topic Date Due  . OPHTHALMOLOGY EXAM  07/30/1970  . URINE MICROALBUMIN   05/08/2018  . INFLUENZA VACCINE  08/15/2018  . HEMOGLOBIN A1C  09/02/2018    There are no preventive care reminders to display for this patient.  Lab Results  Component Value Date   TSH 0.754 03/04/2018   Lab Results  Component Value Date   WBC 4.3 02/05/2017   HGB 14.8 02/05/2017   HCT 43.2 02/05/2017   MCV 91 02/05/2017   PLT 250 02/05/2017   Lab Results  Component Value Date   NA 141 03/04/2018   K 4.3 03/04/2018   CO2 22 03/04/2018   GLUCOSE 122 (H) 03/04/2018   BUN 17 03/04/2018   CREATININE 0.93 03/04/2018   BILITOT 1.1 03/04/2018   ALKPHOS 83 03/04/2018   AST 16 03/04/2018   ALT 15 03/04/2018   PROT 7.3 03/04/2018   ALBUMIN 4.8 03/04/2018   CALCIUM 9.9 03/04/2018   Lab Results  Component Value Date   CHOL 195 03/04/2018   Lab Results  Component Value Date   HDL 59 03/04/2018   Lab Results  Component Value Date   LDLCALC 119 (H) 03/04/2018   Lab Results  Component Value Date   TRIG 87 03/04/2018   Lab Results  Component Value Date   CHOLHDL 3.3 03/04/2018   Lab Results  Component Value Date   HGBA1C 6.4 (A) 03/04/2018      Assessment & Plan:   Problem List Items Addressed This Visit    None    Visit Diagnoses    Well controlled type 2 diabetes mellitus ([place])    -  Primary well controlled hemoglobin a1c is at goal Continue exercise Lipids monitored and renal function in range Diet controlled Added statin at low dose On asa '81mg'$  Reviewed diabetic foot care Emphasized importance of eye and dental exam      Relevant Orders  Comprehensive metabolic panel   POCT glycosylated hemoglobin (Hb A1C)   Microalbumin, urine   Dyslipidemia, goal LDL below 130    -  Discussed statin for prevention of ASCVD in diabetics   Relevant Orders   Lipid panel   Microalbumin, urine   Overweight    - improved from obesity   Relevant Orders   Lipid panel   Encounter for medication monitoring       Relevant Orders   Comprehensive metabolic panel       No orders of the defined types were placed in this encounter.   Follow-up: No follow-ups on file.    Forrest Moron, MD

## 2018-09-04 NOTE — Patient Instructions (Addendum)
If you have lab work done today you will be contacted with your lab results within the next 2 weeks.  If you have not heard from [place] then please contact us. The fastest way to get your results is to register for My Chart.   IF you received an x-ray today, you will receive an invoice from [facility]. Please contact [facility] at [phone] with questions or concerns regarding your invoice.   IF you received labwork today, you will receive an invoice from [facility]. Please contact [facility] at [phone] with questions or concerns regarding your invoice.   Our billing staff will not be able to assist you with questions regarding bills from these companies.  You will be contacted with the lab results as soon as they are available. The fastest way to get your results is to activate your My Chart account. Instructions are located on the last page of this paperwork. If you have not heard from [place] regarding the results in 2 weeks, please contact this office.      Coronary Artery Disease, Female Coronary artery disease (CAD) is a condition in which the arteries that lead to the heart (coronary arteries) become narrow or blocked. The narrowing or blockage can lead to decreased blood flow to the heart. Prolonged reduced blood flow can cause a heart attack (myocardial infarction or MI). This condition may also be called coronary heart disease. Because CAD is the leading cause of death in women, it is important to understand what causes this condition and how it is treated. What are the causes? CAD is most often caused by atherosclerosis. This is the buildup of fat and cholesterol (plaque) on the inside of the arteries. Over time, the plaque may narrow or block the artery, reducing blood flow to the heart. Plaque can also become weak and break off within a coronary artery and cause a sudden blockage. Other less common causes of CAD include:  A blood clot or a piece of a blood  clot or other substance that blocks the flow of blood in a coronary artery (embolism).  A tearing of the artery (spontaneous coronary artery dissection).  An enlargement of an artery (aneurysm).  Inflammation (vasculitis) in the artery wall. What increases the risk? The following factors may make you more likely to develop this condition:  Age. Women over age 47 are at a greater risk of CAD.  Family history of CAD.  High blood pressure (hypertension).  Diabetes.  High cholesterol levels.  Tobacco use.  Lack of exercise.  Menopause. ? All postmenopausal women are at greater risk of CAD. ? Women who have experienced menopause between the ages of 10-45 (early menopause) are at a higher risk of CAD. ? Women who have experienced menopause before age 53 (premature menopause) are at a very high risk of CAD.  Excessive alcohol use.  A diet high in saturated and trans fats, such as fried food and processed meat. Other possible risk factors include:  High stress levels.  Depression.  Obesity.  Sleep apnea. What are the signs or symptoms? Many people do not have any symptoms during the early stages of CAD. As the condition progresses, symptoms may include:  Chest pain (angina). The pain can: ? Feel like crushing or squeezing, or like a tightness, pressure, fullness, or heaviness in the chest. ? Last more than a few minutes or can stop and recur. The pain tends to get worse with exercise or stress and to fade with  rest.  Pain in the arms, neck, jaw, ear, or back.  Unexplained heartburn or indigestion.  Shortness of breath.  Nausea.  Sudden cold sweats.  Sudden light-headedness.  Fluttering or fast heartbeat (palpitations). Many women have chest discomfort and the other symptoms. However, women often have unusual (atypical) symptoms, such as:  Fatigue.  Vomiting.  Unexplained feelings of nervousness or anxiety.  Unexplained weakness.  Dizziness or  fainting. How is this diagnosed? This condition is diagnosed based on:  Your family and medical history.  A physical exam.  Tests, including: ? A test to check the electrical signals in your heart (electrocardiogram). ? Exercise stress test. This looks for signs of blockage when the heart is stressed with exercise, such as running on a treadmill. ? Pharmacologic stress test. This test looks for signs of blockage when the heart is being stressed with a medicine. ? Blood tests. ? Coronary angiogram. This is a procedure to look at the coronary arteries to see if there is any blockage. During this test, a dye is injected into your arteries so they appear on an X-ray. ? Coronary artery CT scan. This CT scan helps detect calcium deposits in your coronary arteries. Calcium deposits are an indicator of CAD. ? A test that uses sound waves to take a picture of your heart (echocardiogram). ? Chest X-ray. How is this treated? This condition may be treated by:  Healthy lifestyle changes to reduce risk factors.  Medicines such as: ? Antiplatelet medicines and blood-thinning medicines, such as aspirin. These help to prevent blood clots. ? Nitroglycerin. ? Blood pressure medicines. ? Cholesterol-lowering medicine.  Coronary angioplasty and stenting. During this procedure, a thin, flexible tube is inserted through a blood vessel and into a blocked artery. A balloon or similar device on the end of the tube is inflated to open up the artery. In some cases, a small, mesh tube (stent) is inserted into the artery to keep it open.  Coronary artery bypass surgery. During this surgery, veins or arteries from other parts of the body are used to create a bypass around the blockage and allow blood to reach your heart. Follow these instructions at home: Medicines  Take over-the-counter and prescription medicines only as told by your health care provider.  Do not take the following medicines unless your health  care provider approves: ? NSAIDs, such as ibuprofen, naproxen, or celecoxib. ? Vitamin supplements that contain vitamin A, vitamin E, or both. ? Hormone replacement therapy that contains estrogen with or without progestin. Lifestyle  Follow an exercise program approved by your health care provider. Aim for 150 minutes of moderate exercise or 75 minutes of vigorous exercise each week.  Maintain a healthy weight or lose weight as approved by your health care provider.  Learn to manage stress or try to limit your stress. Ask your health care provider for suggestions if you need help.  Get screened for depression and seek treatment, if needed.  Do not use any products that contain nicotine or tobacco, such as cigarettes, e-cigarettes, and chewing tobacco. If you need help quitting, ask your health care provider.  Do not use illegal drugs. Eating and drinking   Follow a heart-healthy diet. A dietitian can help educate you about healthy food options and changes. In general, eat plenty of fruits and vegetables, lean meats, and whole grains.  Avoid foods high in: ? Sugar. ? Salt (sodium). ? Saturated fats, such as processed or fatty meat. ? Trans fats, such as fried food.  Use healthy cooking methods such as roasting, grilling, broiling, baking, poaching, steaming, or stir-frying.  Do not drink alcohol if: ? Your health care provider tells you not to drink. ? You are pregnant, may be pregnant, or are planning to become pregnant.  If you drink alcohol: ? Limit how much you have to 0-1 drink a day. ? Be aware of how much alcohol is in your drink. In the [place] one drink equals one 12 oz bottle of beer (355 mL), one 5 oz glass of wine (148 mL), or one 1 oz glass of hard liquor (44 mL). General instructions  Manage any other health conditions, such as hypertension and diabetes. These conditions affect your heart.  Your health care provider may ask you to monitor your blood pressure.  Ideally, your blood pressure should be below 130/80.  Keep all follow-up visits as told by your health care provider. This is important. Get help right away if:  You have pain in your chest, neck, ear, arm, jaw, stomach, or back that: ? Lasts more than a few minutes. ? Is recurring. ? Is not relieved by taking medicine under your tongue (sublingual nitroglycerin).  You have profuse sweating without cause.  You have unexplained: ? Heartburn or indigestion. ? Shortness of breath or difficulty breathing. ? Fluttering or fast heartbeat (palpitations). ? Nausea or vomiting. ? Fatigue. ? Feelings of nervousness or anxiety. ? Weakness. ? Diarrhea.  You have sudden light-headedness or dizziness.  You faint.  You feel like hurting yourself or think about taking your own life. These symptoms may represent a serious problem that is an emergency. Do not wait to see if the symptoms will go away. Get medical help right away. Call your local emergency services (911 in the [place] Do not drive yourself to the hospital. Summary  Coronary artery disease (CAD) is a condition in which the arteries that lead to the heart (coronary arteries) become narrow or blocked. The narrowing or blockage can lead to a heart attack.  Many women have chest discomfort and other common symptoms of CAD. However, women often have unusual (atypical) symptoms, such as fatigue, vomiting, weakness, or dizziness.  CAD can be treated with lifestyle changes, medicines, surgery, or a combination of these treatments. This information is not intended to replace advice given to you by your health care provider. Make sure you discuss any questions you have with your health care provider. Document Released: 03/25/2011 Document Revised: 09/19/2017 Document Reviewed: 09/09/2017 Elsevier Patient Education  2020 [employer].

## 2018-09-05 LAB — LIPID PANEL
Chol/HDL Ratio: 3.3 ratio (ref 0.0–4.4)
Cholesterol, Total: 192 mg/dL (ref 100–199)
HDL: 59 mg/dL
LDL Calculated: 119 mg/dL — ABNORMAL HIGH (ref 0–99)
Triglycerides: 69 mg/dL (ref 0–149)
VLDL Cholesterol Cal: 14 mg/dL (ref 5–40)

## 2018-09-05 LAB — COMPREHENSIVE METABOLIC PANEL
ALT: 13 IU/L (ref 0–32)
AST: 14 IU/L (ref 0–40)
Albumin/Globulin Ratio: 2 (ref 1.2–2.2)
Albumin: 4.5 g/dL (ref 3.8–4.9)
Alkaline Phosphatase: 72 IU/L (ref 39–117)
BUN/Creatinine Ratio: 19 (ref 9–23)
BUN: 18 mg/dL (ref 6–24)
Bilirubin Total: 1.3 mg/dL — ABNORMAL HIGH (ref 0.0–1.2)
CO2: 23 mmol/L (ref 20–29)
Calcium: 9.6 mg/dL (ref 8.7–10.2)
Chloride: 104 mmol/L (ref 96–106)
Creatinine, Ser: 0.95 mg/dL (ref 0.57–1.00)
GFR calc Af Amer: 76 mL/min/{1.73_m2} (ref >59)
GFR calc non Af Amer: 66 mL/min/{1.73_m2} (ref >59)
Globulin, Total: 2.3 g/dL (ref 1.5–4.5)
Glucose: 125 mg/dL — ABNORMAL HIGH (ref 65–99)
Potassium: 4.4 mmol/L (ref 3.5–5.2)
Sodium: 142 mmol/L (ref 134–144)
Total Protein: 6.8 g/dL (ref 6.0–8.5)

## 2018-09-05 LAB — MICROALBUMIN, URINE: Microalbumin, Urine: 9.3 ug/mL

## 2018-09-05 LAB — FSH/LH
FSH: 65.8 m[IU]/mL
LH: 37.7 m[IU]/mL

## 2018-09-12 ENCOUNTER — Encounter: Payer: Self-pay | Admitting: Family Medicine

## 2019-01-03 IMAGING — MG 2D DIGITAL SCREENING BILATERAL MAMMOGRAM WITH CAD AND ADJUNCT TO
8 of 13 series · 8 of 29 positions shown · non-contrast
Comparison: Previous exam(s).

CLINICAL DATA: Screening.

EXAM:
2D DIGITAL SCREENING BILATERAL MAMMOGRAM WITH CAD AND ADJUNCT TOMO

[L MLO (1 of 2)]
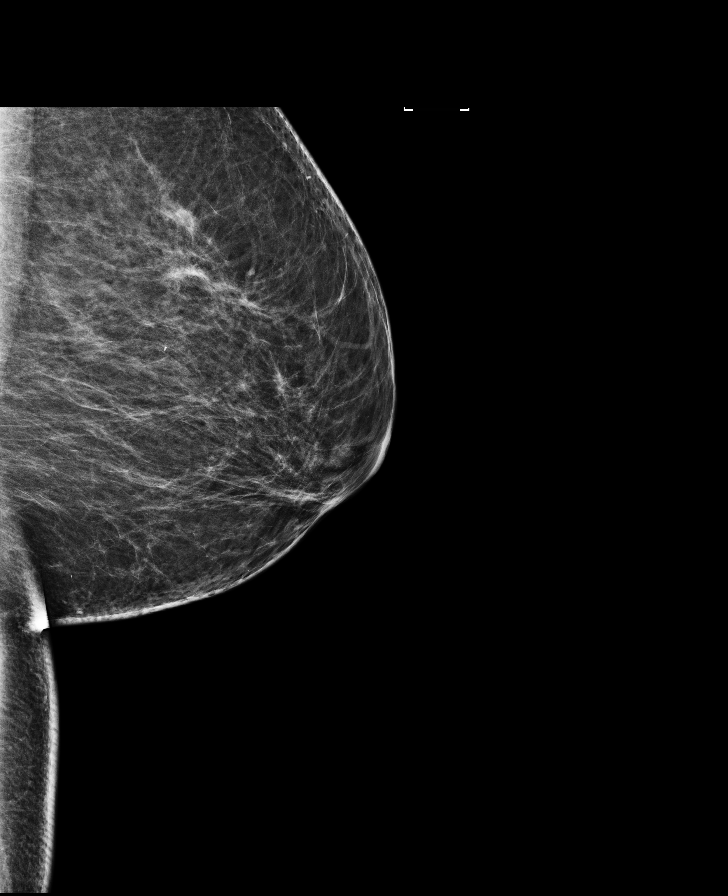

[R CC synth-2D]
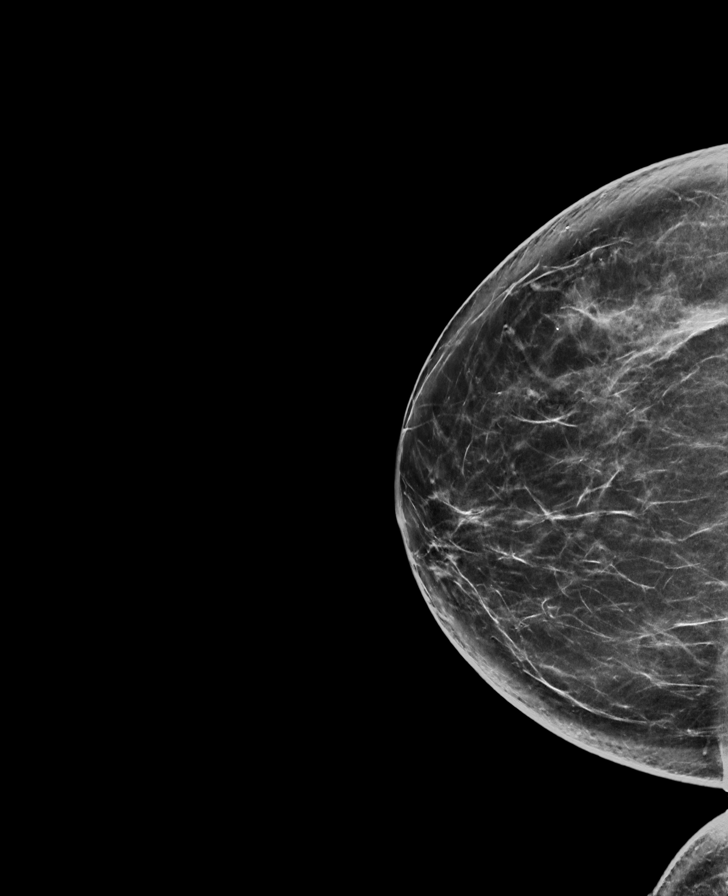

[R MLO]
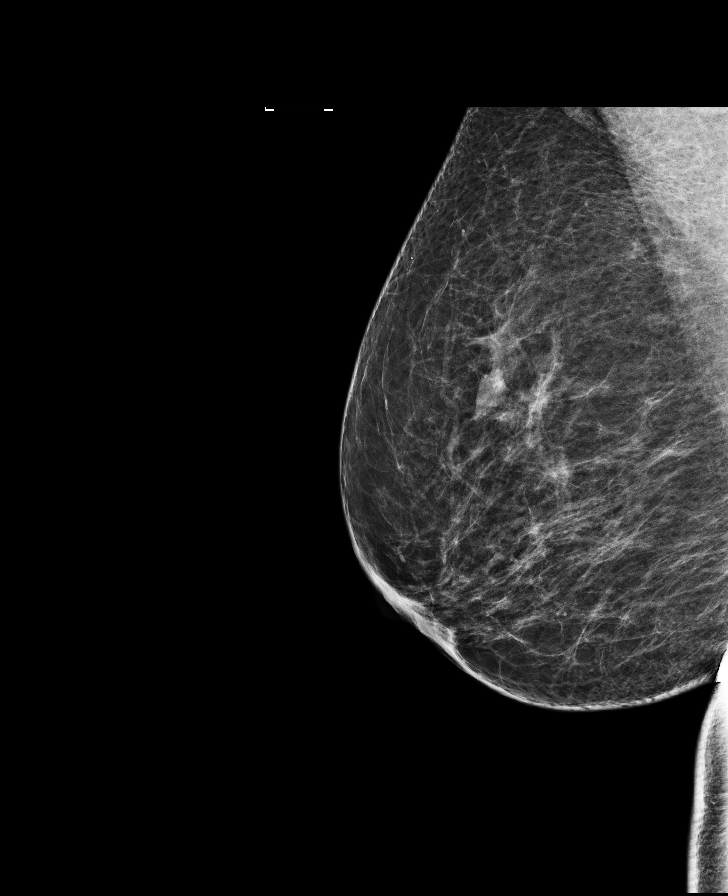

[R CC]
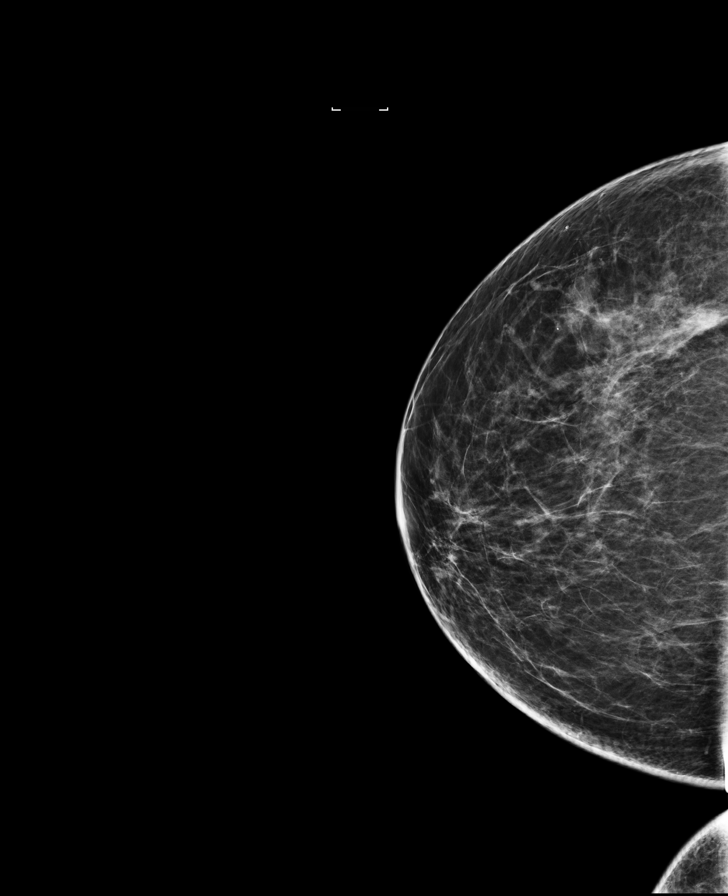

[L CC synth-2D]
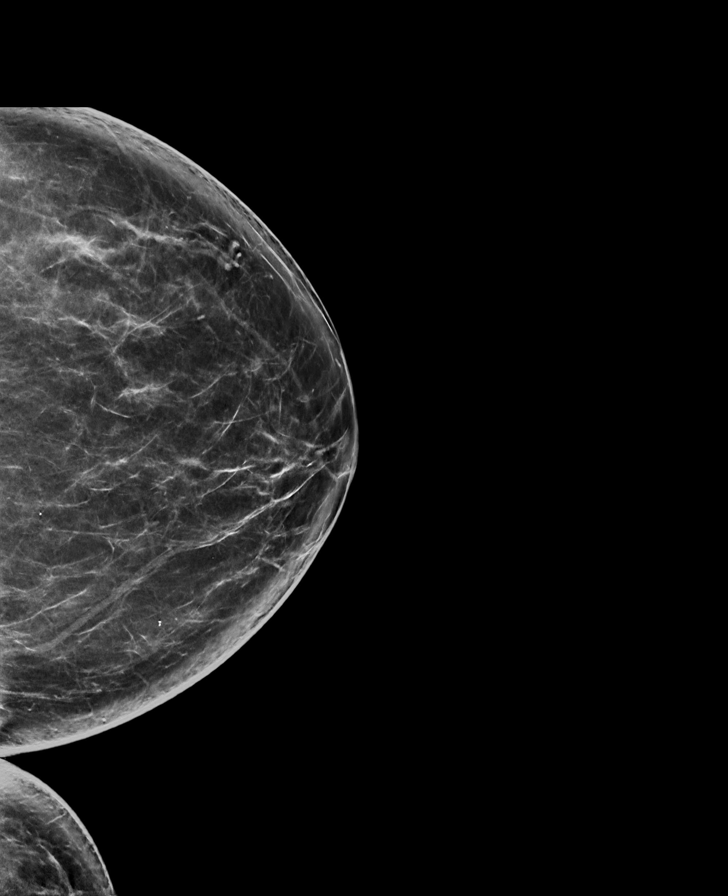

[L MLO (2 of 2)]
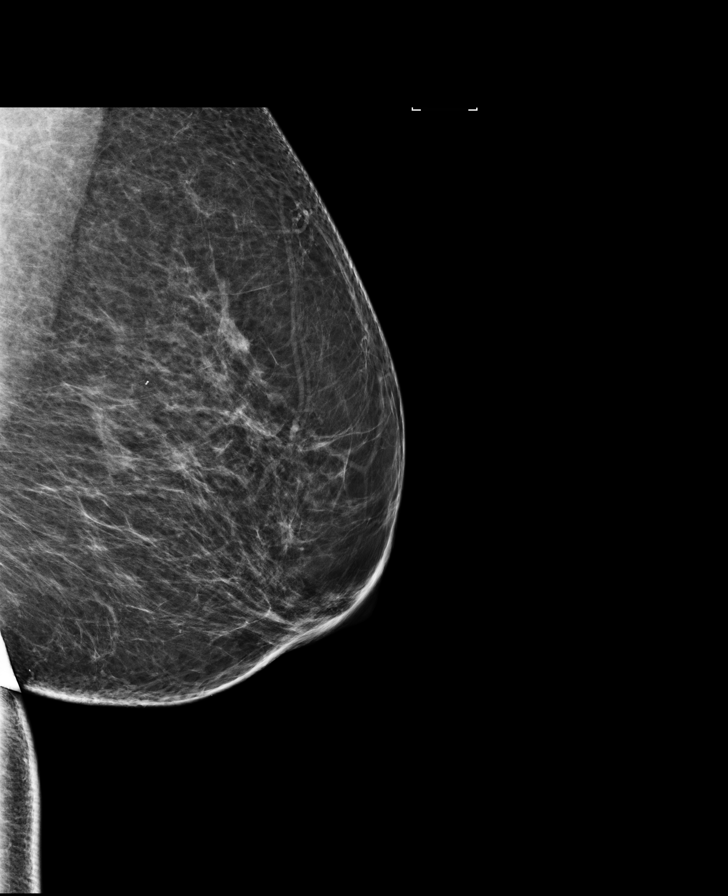

[R MLO synth-2D]
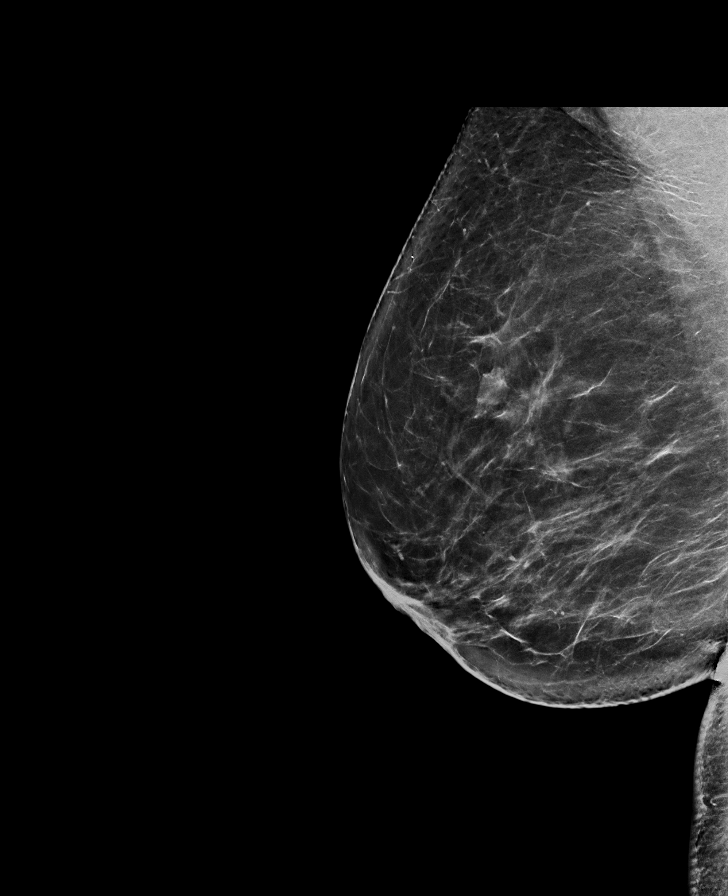

[L CC]
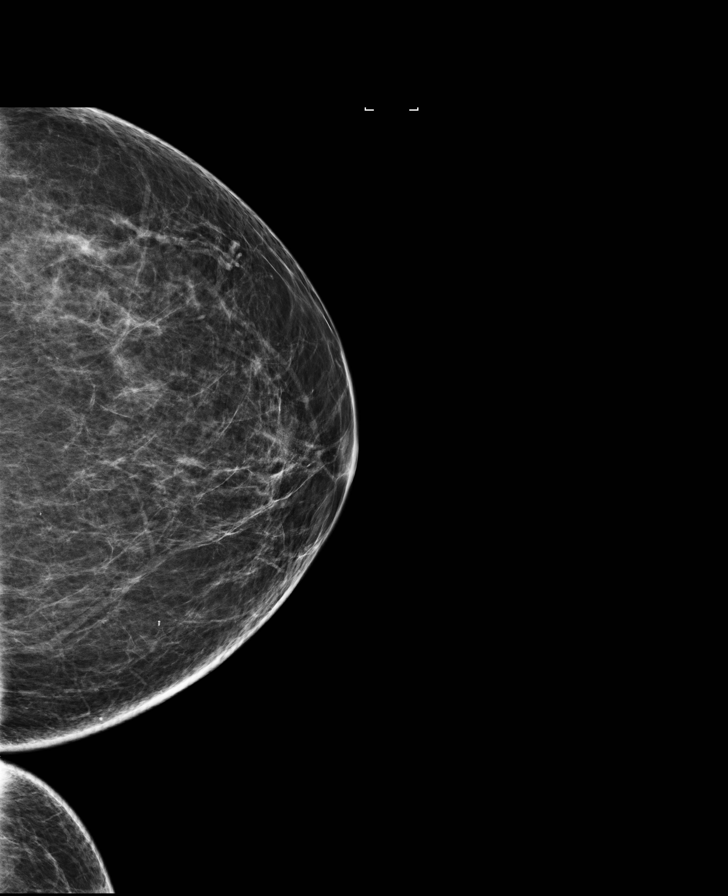

[8 of 29 positions shown; findings below may reference images not displayed]

ACR Breast Density Category b: There are scattered areas of
fibroglandular density.
FINDINGS: There are no findings suspicious for malignancy. Images were
processed with CAD.
IMPRESSION: No mammographic evidence of malignancy. A result letter of this
screening mammogram will be mailed directly to the patient.

RECOMMENDATION:
Screening mammogram in one year. (Code:97-6-RS4)

BI-RADS CATEGORY  1: Negative.

## 2019-01-14 ENCOUNTER — Other Ambulatory Visit: Payer: Self-pay | Admitting: Family Medicine

## 2019-01-14 ENCOUNTER — Telehealth: Payer: Self-pay | Admitting: Family Medicine

## 2019-01-14 DIAGNOSIS — Z1231 Encounter for screening mammogram for malignant neoplasm of breast: Secondary | ICD-10-CM

## 2019-01-14 NOTE — Telephone Encounter (Signed)
Copied from [place] [id number]. Topic: Referral - Request for Referral >> Jan 14, 2019 10:24 AM Alanda Slim E wrote: Has patient seen PCP for this complaint? No  *If NO, is insurance requiring patient see PCP for this issue before PCP can refer them? Referral for which specialty: mammogram /annual check up  Preferred provider/office: The breast center  Reason for referral: Pain in left breast   Pt was scheduling her annual exam with the breast center but when she mentioned she had pain in her left breast they advised her to get a referral sent from her PCP

## 2019-01-19 NOTE — Telephone Encounter (Signed)
Pt is calling back regarding the referral request / FR pt would stay on schedule her last mammo was 02/09/2018

## 2019-01-21 NOTE — Telephone Encounter (Signed)
Pt calling back to see if there is any update in regards to the referral request that she made   Please advise

## 2019-01-24 ENCOUNTER — Encounter: Payer: Self-pay | Admitting: Family Medicine

## 2019-01-27 ENCOUNTER — Telehealth: Payer: Self-pay

## 2019-01-27 ENCOUNTER — Other Ambulatory Visit: Payer: Self-pay | Admitting: Family Medicine

## 2019-01-27 DIAGNOSIS — N644 Mastodynia: Secondary | ICD-10-CM

## 2019-01-27 NOTE — Telephone Encounter (Signed)
I sent in the order in stat this morning. Let her know the order is sent to [facility] imaging as a stat order.

## 2019-01-27 NOTE — Telephone Encounter (Signed)
Spoke with pt re: mammogram and breast ultrasound as pt has been calling regarding this since 01/14/2019.  Advised dr. Nolon Rod sent stat order to [facility] for mammogram and ultrasound and she should be hearing from them soon.  I advised to contact me if she has not heard from them by tomorrow.  Pt agreeable.

## 2019-01-27 NOTE — Telephone Encounter (Signed)
Called pt and informed her the message from provider. She stated that Delores has just told her the information. I stated understanding.

## 2019-01-27 NOTE — Telephone Encounter (Signed)
01/27/2019 - PATIENT STATES THIS IS HER 4th TIME CALLING TRYING TO GET A REFERRAL FROM DR. STALLINGS TO GET A DIAGNOSTIC ULTRASOUND SCREENING FOR A PAIN IN HER (L) BREAST. SHE SAID HER SISTER JUST HAD TO HAVE BREAST CANCER SURGERY. SHE WOULD LIKE TO GET A CALL BACK AS SOON AS POSSIBLE PLEASE. BEST PHONE [phone] (CELL) [place]

## 2019-01-27 NOTE — Telephone Encounter (Signed)
Please Advise

## 2019-01-28 ENCOUNTER — Other Ambulatory Visit: Payer: Self-pay | Admitting: Family Medicine

## 2019-01-28 DIAGNOSIS — N644 Mastodynia: Secondary | ICD-10-CM

## 2019-02-11 ENCOUNTER — Ambulatory Visit: Payer: Managed Care, Other (non HMO)

## 2019-02-11 ENCOUNTER — Other Ambulatory Visit: Payer: Self-pay

## 2019-02-11 ENCOUNTER — Ambulatory Visit
Admission: RE | Admit: 2019-02-11 | Discharge: 2019-02-11 | Disposition: A | Discharge status: Home / Self Care | Payer: Managed Care, Other (non HMO) | Source: Ambulatory Visit | Attending: Family Medicine | Admitting: Family Medicine

## 2019-02-11 DIAGNOSIS — N644 Mastodynia: Secondary | ICD-10-CM

## 2019-03-16 ENCOUNTER — Encounter: Payer: Self-pay | Admitting: Family Medicine

## 2019-03-25 ENCOUNTER — Encounter: Payer: Self-pay | Admitting: Family Medicine

## 2019-04-06 ENCOUNTER — Encounter: Payer: Self-pay | Admitting: Family Medicine

## 2019-04-09 ENCOUNTER — Ambulatory Visit (INDEPENDENT_AMBULATORY_CARE_PROVIDER_SITE_OTHER): Payer: Managed Care, Other (non HMO) | Admitting: Family Medicine

## 2019-04-09 ENCOUNTER — Other Ambulatory Visit: Payer: Self-pay

## 2019-04-09 DIAGNOSIS — E119 Type 2 diabetes mellitus without complications: Secondary | ICD-10-CM

## 2019-04-10 LAB — CMP14+EGFR
ALT: 19 IU/L (ref 0–32)
AST: 19 IU/L (ref 0–40)
Albumin/Globulin Ratio: 1.5 (ref 1.2–2.2)
Albumin: 4.3 g/dL (ref 3.8–4.9)
Alkaline Phosphatase: 81 IU/L (ref 39–117)
BUN/Creatinine Ratio: 20 (ref 9–23)
BUN: 21 mg/dL (ref 6–24)
Bilirubin Total: 0.9 mg/dL (ref 0.0–1.2)
CO2: 21 mmol/L (ref 20–29)
Calcium: 9.6 mg/dL (ref 8.7–10.2)
Chloride: 104 mmol/L (ref 96–106)
Creatinine, Ser: 1.04 mg/dL — ABNORMAL HIGH (ref 0.57–1.00)
GFR calc Af Amer: 68 mL/min/1.73 (ref >59)
GFR calc non Af Amer: 59 mL/min/1.73 — ABNORMAL LOW (ref >59)
Globulin, Total: 2.8 g/dL (ref 1.5–4.5)
Glucose: 134 mg/dL — ABNORMAL HIGH (ref 65–99)
Potassium: 4.3 mmol/L (ref 3.5–5.2)
Sodium: 141 mmol/L (ref 134–144)
Total Protein: 7.1 g/dL (ref 6.0–8.5)

## 2019-04-10 LAB — LIPID PANEL
Chol/HDL Ratio: 2.8 ratio (ref 0.0–4.4)
Cholesterol, Total: 166 mg/dL (ref 100–199)
HDL: 60 mg/dL (ref >39)
LDL Chol Calc (NIH): 92 mg/dL (ref 0–99)
Triglycerides: 76 mg/dL (ref 0–149)
VLDL Cholesterol Cal: 14 mg/dL (ref 5–40)

## 2019-04-10 LAB — HEMOGLOBIN A1C
Est. average glucose Bld gHb Est-mCnc: 134 mg/dL
Hgb A1c MFr Bld: 6.3 % — ABNORMAL HIGH (ref 4.8–5.6)

## 2019-04-12 ENCOUNTER — Ambulatory Visit (INDEPENDENT_AMBULATORY_CARE_PROVIDER_SITE_OTHER): Payer: Managed Care, Other (non HMO) | Admitting: Family Medicine

## 2019-04-12 ENCOUNTER — Other Ambulatory Visit: Payer: Self-pay

## 2019-04-12 ENCOUNTER — Encounter: Payer: Self-pay | Admitting: Family Medicine

## 2019-04-12 VITALS — BP 128/85 | HR 67 | Temp 97.3°F | Resp 17 | Ht 65.0 in | Wt 162.8 lb

## 2019-04-12 DIAGNOSIS — Z0001 Encounter for general adult medical examination with abnormal findings: Secondary | ICD-10-CM

## 2019-04-12 DIAGNOSIS — E663 Overweight: Secondary | ICD-10-CM | POA: Diagnosis not present

## 2019-04-12 DIAGNOSIS — Z5181 Encounter for therapeutic drug level monitoring: Secondary | ICD-10-CM

## 2019-04-12 DIAGNOSIS — Z0000 Encounter for general adult medical examination without abnormal findings: Secondary | ICD-10-CM

## 2019-04-12 NOTE — Progress Notes (Signed)
Chief Complaint  Patient presents with  . Annual Exam    no pap     Subjective:  Chelsea Mckay is a 59 y.o. female here for a health maintenance visit.  Patient is established pt    Patient Active Problem List   Diagnosis Date Noted  . Newly diagnosed diabetes ([place]) 05/09/2017  . Colon polyp 04/16/2017  . Postmenopausal bleeding 02/05/2017  . Irregular intermenstrual bleeding 02/05/2017  . Family history of breast cancer in sister 01/10/2016  . Breast mass in female 12/17/2011  . AR (allergic rhinitis) 11/28/2011  . Benign hematuria 11/28/2011  . Hyperglycemia 11/28/2011  . Abnormal cervical Papanicolaou smear 04/15/2010    Past Medical History:  Diagnosis Date  . Allergy   . Anxiety   . Asthma   . Asthmatic bronchitis   . Chest pressure   . Colon polyp 04/16/2017   Done 04/10/17. 52m polyp, transverse colon. Follow up cscopy in 5 years  . Depression   . Headache(784.0)    Barometric pressure migraine headaches  . Kidney stones     Past Surgical History:  Procedure Laterality Date  . BUNIONECTOMY  1999  . CERVICAL POLYPECTOMY  2007  . COLONOSCOPY    . COLONOSCOPY W/ POLYPECTOMY    . DILATATION & CURETTAGE/HYSTEROSCOPY WITH TRUECLEAR N/A 09/24/2013   Procedure: DILATATION & CURETTAGE/HYSTEROSCOPY WITH TRUCLEAR and Endometrial Ablation;  Surgeon: NBetsy Coder MD;  [facility];  Service: Gynecology;  Laterality: N/A;  . TUBAL LIGATION  1987     Outpatient Medications Prior to Visit  Medication Sig Dispense Refill  . atorvastatin (LIPITOR) 10 MG tablet Take 1 tablet (10 mg total) by mouth once a week. 12 tablet 3  . Blood Glucose Monitoring Suppl (BLOOD GLUCOSE MONITOR SYSTEM) w/Device KIT Use to check blood glucose twice daily. ICD 10 code E11.9. Dispense type covered by insurance. 1 each 0  . glucose blood test strip Use to check blood glucose twice daily. ICD 10 code E11.9. Dispense type covered by insurance. 100 each 12  . Lancet Devices (LANCING  DEVICE) MISC Use to check blood glucose twice daily. ICD 10 code E11.9. Dispense type covered by insurance. 1 each 1  . Lancets 30G MISC Use to check blood glucose twice daily. ICD 10 code E11.9. Dispense type covered by insurance. 100 each 11  . Olopatadine HCl 0.2 % SOLN Apply 1 drop to eye daily. 2.5 mL 0  . fexofenadine (ALLEGRA) 180 MG tablet Take 180 mg by mouth daily as needed for allergies.    . medroxyPROGESTERone (PROVERA) 10 MG tablet TAKE 1 TABLET(10 MG) BY MOUTH DAILY FOR 6 DAYS 30 tablet 2   No facility-administered medications prior to visit.    Allergies  Allergen Reactions  . Adhesive [Tape] Rash  . Ciprofloxacin Nausea And Vomiting and Rash     Family History  Problem Relation Age of Onset  . Diabetes Mother   . Alzheimer's disease Mother   . Cancer Sister   . Diabetes Brother   . Heart disease Father   . Cancer Sister 586      breast ca  . Breast cancer Sister 522 . Diabetes Sister   . Hypertension Sister   . Cancer Sister 470      breast ca  . Breast cancer Sister 46 . Diabetes Brother   . Cancer Brother [phone]      prostate  . Cancer Brother [phone]      prostate ca  Health Habits: Dental Exam: up to date Eye Exam: up to date Exercise:  times/week on average Current exercise activities: walking/running Diet:   Social History   Socioeconomic History  . Marital status: Widowed    Spouse name: Raquel Sarna  . Number of children: 3  . Years of education: 41  . Highest education level: Not on file  Occupational History  . Occupation: Production designer, theatre/television/film    Employer: [employer]  Tobacco Use  . Smoking status: Never Smoker  . Smokeless tobacco: Never Used  Substance and Sexual Activity  . Alcohol use: Yes    Comment: rare  . Drug use: No  . Sexual activity: Not Currently    Partners: Male  Other Topics Concern  . Not on file  Social History Narrative   Lives alone. Husband died unexpectedly of a heart attack at work 11/15/2011.  Good support from pastor and daughters.   Social Determinants of Health   Financial Resource Strain:   . Difficulty of Paying Living Expenses:   Food Insecurity:   . Worried About Charity fundraiser in the Last Year:   . Arboriculturist in the Last Year:   Transportation Needs:   . Film/video editor (Medical):   Marland Kitchen Lack of Transportation (Non-Medical):   Physical Activity:   . Days of Exercise per Week:   . Minutes of Exercise per Session:   Stress:   . Feeling of Stress :   Social Connections:   . Frequency of Communication with Friends and Family:   . Frequency of Social Gatherings with Friends and Family:   . Attends Religious Services:   . Active Member of Clubs or Organizations:   . Attends Archivist Meetings:   Marland Kitchen Marital Status:   Intimate Partner Violence:   . Fear of Current or Ex-Partner:   . Emotionally Abused:   Marland Kitchen Physically Abused:   . Sexually Abused:    Social History   Substance and Sexual Activity  Alcohol Use Yes   Comment: rare   Social History   Tobacco Use  Smoking Status Never Smoker  Smokeless Tobacco Never Used   Social History   Substance and Sexual Activity  Drug Use No    GYN: Sexual Health Menstrual status: regular menses LMP: No LMP recorded. Patient is perimenopausal. Last pap smear: see HM section History of abnormal pap smears:  Sexually active: with female partner Current contraception: none  Health Maintenance: See under health Maintenance activity for review of completion dates as well. Immunization History  Administered Date(s) Administered  . Hepatitis B, adult 08/07/2017  . Influenza Split 10/29/2011  . Influenza-Unspecified 11/03/2014, 11/03/2015, 10/31/2016  . Pneumococcal Conjugate-13 11/03/2014  . Tdap 01/10/2016  . Zoster Recombinat (Shingrix) 10/31/2016      Depression Screen-PHQ2/9 Depression screen [facility] 2/9 04/12/2019 09/04/2018 03/04/2018 08/07/2017 05/09/2017  Decreased Interest 0 0 0 0 0   Down, Depressed, Hopeless 0 0 1 0 0  PHQ - 2 Score 0 0 1 0 0  Altered sleeping - - - - -  Tired, decreased energy - - - - -  Change in appetite - - - - -  Feeling bad or failure about yourself  - - - - -  Trouble concentrating - - - - -  Moving slowly or fidgety/restless - - - - -  Suicidal thoughts - - - - -  PHQ-9 Score - - - - -  Difficult doing work/chores - - - - -  Depression Severity and Treatment Recommendations:  0-4= None  5-9= Mild / Treatment: Support, educate to call if worse; return in one month  10-14= Moderate / Treatment: Support, watchful waiting; Antidepressant or Psycotherapy  15-19= Moderately severe / Treatment: Antidepressant OR Psychotherapy  >= 20 = Major depression, severe / Antidepressant AND Psychotherapy    Review of Systems   ROS  See HPI for ROS as well.   Review of Systems  Constitutional: Negative for activity change, appetite change, chills and fever.  HENT: Negative for congestion, nosebleeds, trouble swallowing and voice change.   Respiratory: Negative for cough, shortness of breath and wheezing.   Gastrointestinal: Negative for diarrhea, nausea and vomiting.  Genitourinary: Negative for difficulty urinating, dysuria, flank pain and hematuria.  Musculoskeletal: Negative for back pain, joint swelling and neck pain.  Neurological: Negative for dizziness, speech difficulty, light-headedness and numbness.  See HPI. All other review of systems negative.    Objective:   Vitals:   04/12/19 1350  BP: 128/85  Pulse: 67  Resp: 17  Temp: (!) 97.3 F (36.3 C)  TempSrc: Oral  SpO2: 97%  Weight: 162 lb 12.8 oz (73.8 kg)  Height: _0  (1.651 m)    Body mass index is 27.09 kg/m.  Physical Exam  BP 128/85 (BP Location: Right Arm, Patient Position: Sitting, Cuff Size: Normal)   Pulse 67   Temp (!) 97.3 F (36.3 C) (Oral)   Resp 17   Ht _1  (1.651 m)   Wt 162 lb 12.8 oz (73.8 kg)   SpO2 97%   BMI 27.09 kg/m   General  Appearance:    Alert, cooperative, no distress, appears stated age  Head:    Normocephalic, without obvious abnormality, atraumatic  Eyes:    conjunctiva/corneas clear, EOM's intact  Ears:    Normal TM's and external ear canals, both ears  Nose:   Nares normal, septum midline, mucosa normal, no drainage    or sinus tenderness  Throat:   Lips, mucosa, and tongue normal; teeth and gums normal  Neck:   Supple, symmetrical, trachea midline, no adenopathy;    thyroid:  no enlargement/tenderness/nodules  Back:     Symmetric, no curvature, ROM normal, no CVA tenderness  Lungs:     Clear to auscultation bilaterally, respirations unlabored  Chest Wall:    No tenderness or deformity   Heart:    Regular rate and rhythm, S1 and S2 normal, no murmur, rub   or gallop  Breast Exam:    Mammogram done  Abdomen:     Soft, non-tender, bowel sounds active all four quadrants,    no masses, no organomegaly  Extremities:   Extremities normal, atraumatic, no cyanosis or edema  Pulses:   2+ and symmetric all extremities  Skin:   Skin color, texture, turgor normal, no rashes or lesions  Lymph nodes:   Cervical, supraclavicular, and axillary nodes normal  Neurologic:   CNII-XII intact, normal strength, sensation and reflexes    throughout      Assessment/Plan:   Patient was seen for a health maintenance exam.  Counseled the patient on health maintenance issues. Reviewed her health mainteance schedule and ordered appropriate tests (see orders.) Counseled on regular exercise and weight management. Recommend regular eye exams and dental cleaning.   The following issues were addressed today for health maintenance:   Natayla was seen today for annual exam.  Diagnoses and all orders for this visit:  Encounter for physical examination  discussed her labs Reviewed her  mammogram Discussed her pap smear and vaccinations   No follow-ups on file.    Body mass index is 27.09 kg/m.:  Discussed the patient's  BMI with patient. The BMI body mass index is 27.09 kg/m.     No future appointments.  Patient Instructions       If you have lab work done today you will be contacted with your lab results within the next 2 weeks.  If you have not heard from [place] then please contact us. The fastest way to get your results is to register for My Chart.   IF you received an x-ray today, you will receive an invoice from [facility]. Please contact [facility] at [phone] with questions or concerns regarding your invoice.   IF you received labwork today, you will receive an invoice from [place]. Please contact [facility] at [phone] with questions or concerns regarding your invoice.   Our billing staff will not be able to assist you with questions regarding bills from these companies.  You will be contacted with the lab results as soon as they are available. The fastest way to get your results is to activate your My Chart account. Instructions are located on the last page of this paperwork. If you have not heard from [place] regarding the results in 2 weeks, please contact this office.

## 2019-04-12 NOTE — Patient Instructions (Addendum)
Wt Readings from Last 3 Encounters:  04/12/19 162 lb 12.8 oz (73.8 kg)  09/04/18 159 lb (72.1 kg)  03/04/18 155 lb 12.8 oz (70.7 kg)     If you have lab work done today you will be contacted with your lab results within the next 2 weeks.  If you have not heard from [place] then please contact us. The fastest way to get your results is to register for My Chart.   IF you received an x-ray today, you will receive an invoice from [facility]. Please contact [facility]iology at [phone] with questions or concerns regarding your invoice.   IF you received labwork today, you will receive an invoice from [place]. Please contact [facility] at [phone] with questions or concerns regarding your invoice.   Our billing staff will not be able to assist you with questions regarding bills from these companies.  You will be contacted with the lab results as soon as they are available. The fastest way to get your results is to activate your My Chart account. Instructions are located on the last page of this paperwork. If you have not heard from [place] regarding the results in 2 weeks, please contact this office.     Health Maintenance, Female Adopting a healthy lifestyle and getting preventive care are important in promoting health and wellness. Ask your health care provider about:  The right schedule for you to have regular tests and exams.  Things you can do on your own to prevent diseases and keep yourself healthy. What should I know about diet, weight, and exercise? Eat a healthy diet   Eat a diet that includes plenty of vegetables, fruits, low-fat dairy products, and lean protein.  Do not eat a lot of foods that are high in solid fats, added sugars, or sodium. Maintain a healthy weight Body mass index (BMI) is used to identify weight problems. It estimates body fat based on height and weight. Your health care provider can help determine your BMI and help you achieve or maintain a  healthy weight. Get regular exercise Get regular exercise. This is one of the most important things you can do for your health. Most adults should:  Exercise for at least 150 minutes each week. The exercise should increase your heart rate and make you sweat (moderate-intensity exercise).  Do strengthening exercises at least twice a week. This is in addition to the moderate-intensity exercise.  Spend less time sitting. Even light physical activity can be beneficial. Watch cholesterol and blood lipids Have your blood tested for lipids and cholesterol at 59 years of age, then have this test every 5 years. Have your cholesterol levels checked more often if:  Your lipid or cholesterol levels are high.  You are older than 59 years of age.  You are at high risk for heart disease. What should I know about cancer screening? Depending on your health history and family history, you may need to have cancer screening at various ages. This may include screening for:  Breast cancer.  Cervical cancer.  Colorectal cancer.  Skin cancer.  Lung cancer. What should I know about heart disease, diabetes, and high blood pressure? Blood pressure and heart disease  High blood pressure causes heart disease and increases the risk of stroke. This is more likely to develop in people who have high blood pressure readings, are of African descent, or are overweight.  Have your blood pressure checked: ? Every 3-5 years if you are 47-64 years of age. ?  Every year if you are 56 years old or older. Diabetes Have regular diabetes screenings. This checks your fasting blood sugar level. Have the screening done:  Once every three years after age 29 if you are at a normal weight and have a low risk for diabetes.  More often and at a younger age if you are overweight or have a high risk for diabetes. What should I know about preventing infection? Hepatitis B If you have a higher risk for hepatitis B, you should  be screened for this virus. Talk with your health care provider to find out if you are at risk for hepatitis B infection. Hepatitis C Testing is recommended for:  Everyone born from [birth year] through 1965.  Anyone with known risk factors for hepatitis C. Sexually transmitted infections (STIs)  Get screened for STIs, including gonorrhea and chlamydia, if: ? You are sexually active and are younger than 59 years of age. ? You are older than 59 years of age and your health care provider tells you that you are at risk for this type of infection. ? Your sexual activity has changed since you were last screened, and you are at increased risk for chlamydia or gonorrhea. Ask your health care provider if you are at risk.  Ask your health care provider about whether you are at high risk for HIV. Your health care provider may recommend a prescription medicine to help prevent HIV infection. If you choose to take medicine to prevent HIV, you should first get tested for HIV. You should then be tested every 3 months for as long as you are taking the medicine. Pregnancy  If you are about to stop having your period (premenopausal) and you may become pregnant, seek counseling before you get pregnant.  Take 400 to 800 micrograms (mcg) of folic acid every day if you become pregnant.  Ask for birth control (contraception) if you want to prevent pregnancy. Osteoporosis and menopause Osteoporosis is a disease in which the bones lose minerals and strength with aging. This can result in bone fractures. If you are 45 years old or older, or if you are at risk for osteoporosis and fractures, ask your health care provider if you should:  Be screened for bone loss.  Take a calcium or vitamin D supplement to lower your risk of fractures.  Be given hormone replacement therapy (HRT) to treat symptoms of menopause. Follow these instructions at home: Lifestyle  Do not use any products that contain nicotine or tobacco, such  as cigarettes, e-cigarettes, and chewing tobacco. If you need help quitting, ask your health care provider.  Do not use street drugs.  Do not share needles.  Ask your health care provider for help if you need support or information about quitting drugs. Alcohol use  Do not drink alcohol if: ? Your health care provider tells you not to drink. ? You are pregnant, may be pregnant, or are planning to become pregnant.  If you drink alcohol: ? Limit how much you use to 0-1 drink a day. ? Limit intake if you are breastfeeding.  Be aware of how much alcohol is in your drink. In the [place] one drink equals one 12 oz bottle of beer (355 mL), one 5 oz glass of wine (148 mL), or one 1 oz glass of hard liquor (44 mL). General instructions  Schedule regular health, dental, and eye exams.  Stay current with your vaccines.  Tell your health care provider if: ? You often feel depressed. ?  You have ever been abused or do not feel safe at home. Summary  Adopting a healthy lifestyle and getting preventive care are important in promoting health and wellness.  Follow your health care provider's instructions about healthy diet, exercising, and getting tested or screened for diseases.  Follow your health care provider's instructions on monitoring your cholesterol and blood pressure. This information is not intended to replace advice given to you by your health care provider. Make sure you discuss any questions you have with your health care provider. Document Revised: 12/24/2017 Document Reviewed: 12/24/2017 Elsevier Patient Education  2020 [employer].

## 2019-05-11 ENCOUNTER — Encounter: Payer: Self-pay | Admitting: Family Medicine

## 2019-05-11 NOTE — Telephone Encounter (Signed)
Please Advise

## 2019-05-12 ENCOUNTER — Encounter: Payer: Self-pay | Admitting: Family Medicine

## 2019-09-22 LAB — HM DIABETES EYE EXAM

## 2020-01-21 ENCOUNTER — Other Ambulatory Visit: Payer: Self-pay | Admitting: Family Medicine

## 2020-01-21 DIAGNOSIS — Z1231 Encounter for screening mammogram for malignant neoplasm of breast: Secondary | ICD-10-CM

## 2020-03-03 ENCOUNTER — Other Ambulatory Visit: Payer: Self-pay

## 2020-03-03 ENCOUNTER — Ambulatory Visit
Admission: RE | Admit: 2020-03-03 | Discharge: 2020-03-03 | Disposition: A | Discharge status: Home / Self Care | Payer: Managed Care, Other (non HMO) | Source: Ambulatory Visit | Attending: Family Medicine | Admitting: Family Medicine

## 2020-03-03 DIAGNOSIS — Z1231 Encounter for screening mammogram for malignant neoplasm of breast: Secondary | ICD-10-CM

## 2021-01-26 ENCOUNTER — Other Ambulatory Visit: Payer: Self-pay | Admitting: Family Medicine

## 2021-01-26 DIAGNOSIS — Z1231 Encounter for screening mammogram for malignant neoplasm of breast: Secondary | ICD-10-CM

## 2021-03-06 ENCOUNTER — Ambulatory Visit
Admission: RE | Admit: 2021-03-06 | Discharge: 2021-03-06 | Disposition: A | Discharge status: Home / Self Care | Payer: 59 | Source: Ambulatory Visit

## 2021-03-06 DIAGNOSIS — Z1231 Encounter for screening mammogram for malignant neoplasm of breast: Secondary | ICD-10-CM

## 2021-04-05 ENCOUNTER — Ambulatory Visit: Payer: 59 | Admitting: Family Medicine

## 2021-04-05 ENCOUNTER — Encounter: Payer: Self-pay | Admitting: Family Medicine

## 2021-04-05 ENCOUNTER — Other Ambulatory Visit: Payer: Self-pay

## 2021-04-05 VITALS — BP 121/84 | HR 85 | Temp 98.0°F | Resp 16 | Ht 65.0 in | Wt 167.0 lb

## 2021-04-05 DIAGNOSIS — E119 Type 2 diabetes mellitus without complications: Secondary | ICD-10-CM

## 2021-04-05 DIAGNOSIS — E785 Hyperlipidemia, unspecified: Secondary | ICD-10-CM | POA: Diagnosis not present

## 2021-04-05 DIAGNOSIS — Z7689 Persons encountering health services in other specified circumstances: Secondary | ICD-10-CM | POA: Diagnosis not present

## 2021-04-05 DIAGNOSIS — Z1211 Encounter for screening for malignant neoplasm of colon: Secondary | ICD-10-CM | POA: Insufficient documentation

## 2021-04-05 LAB — POCT GLYCOSYLATED HEMOGLOBIN (HGB A1C)

## 2021-04-05 NOTE — Progress Notes (Signed)
7.1 

## 2021-04-05 NOTE — Progress Notes (Signed)
? ?New Patient Office Visit ? ?Subjective:  ?Patient ID: Chelsea Mckay, female    DOB: 03-May-1960  Age: 61 y.o. MRN: [id number] ? ?CC:  ?Chief Complaint  ?Patient presents with  ? Establish Care  ? ? ?HPI ?Chelsea Mckay presents for to establish care and for review of chronic med issues. She denies acute complaints or concerns.  ? ?Past Medical History:  ?Diagnosis Date  ? Allergy   ? Anxiety   ? Asthma   ? Asthmatic bronchitis   ? Chest pressure   ? Colon polyp 04/16/2017  ? Done 04/10/17. 2m polyp, transverse colon. Follow up cscopy in 5 years  ? Depression   ? Headache(784.0)   ? Barometric pressure migraine headaches  ? Kidney stones   ? ? ?Past Surgical History:  ?Procedure Laterality Date  ? BUNIONECTOMY  01/14/1997  ? CERVICAL POLYPECTOMY  01/14/2005  ? COLONOSCOPY    ? COLONOSCOPY W/ POLYPECTOMY    ? DILATATION & CURETTAGE/HYSTEROSCOPY WITH TRUECLEAR N/A 09/24/2013  ? Procedure: DILATATION & CURETTAGE/HYSTEROSCOPY WITH TRUCLEAR and Endometrial Ablation;  Surgeon: NBetsy Coder MD;  [facility];  Service: Gynecology;  Laterality: N/A;  ? TUBAL LIGATION  01/14/1985  ? ? ?Family History  ?Problem Relation Age of Onset  ? Diabetes Mother   ? Alzheimer's disease Mother   ? Cancer Sister   ? Diabetes Brother   ? Heart disease Father   ? Cancer Sister   ?     breast ca  ? Breast cancer Sister 567 ? Diabetes Sister   ? Hypertension Sister   ? Breast cancer Sister   ? Cancer Sister   ? Cancer Sister   ?     breast ca  ? Breast cancer Sister 462 ? Diabetes Brother   ? Cancer Brother   ?     prostate  ? Cancer Brother 624 ?     prostate ca  ? ? ?Social History  ? ?Socioeconomic History  ? Marital status: Widowed  ?  Spouse name: SRaquel Sarna ? Number of children: 3  ? Years of education: 153 ? Highest education level: Not on file  ?Occupational History  ? Occupation: Document Control Specialist  ?  Employer: [employer]  ?Tobacco Use  ? Smoking status: Never  ? Smokeless tobacco: Never  ?Substance and Sexual  Activity  ? Alcohol use: Yes  ?  Comment: rare  ? Drug use: Never  ? Sexual activity: Yes  ?  Partners: Male  ?  Birth control/protection: Post-menopausal  ?Other Topics Concern  ? Not on file  ?Social History Narrative  ? Lives alone. Husband died unexpectedly of a heart attack at work 11/15/2011. Good support from pastor and daughters.  ? ?Social Determinants of Health  ? ?Financial Resource Strain: Not on file  ?Food Insecurity: Not on file  ?Transportation Needs: Not on file  ?Physical Activity: Not on file  ?Stress: Not on file  ?Social Connections: Not on file  ?Intimate Partner Violence: Not on file  ? ? ?ROS ?Review of Systems  ?All other systems reviewed and are negative. ? ?Objective:  ? ?Today's Vitals: BP 121/84   Pulse 85   Temp 98 ?F (36.7 ?C) (Oral)   Resp 16   Ht 5' 5" (1.651 m)   Wt 167 lb (75.8 kg)   SpO2 95%   BMI 27.79 kg/m?  ? ?Physical Exam ?Vitals and nursing note reviewed.  ?Constitutional:   ?   General:  She is not in acute distress. ?Cardiovascular:  ?   Rate and Rhythm: Normal rate and regular rhythm.  ?Pulmonary:  ?   Effort: Pulmonary effort is normal.  ?   Breath sounds: Normal breath sounds.  ?Abdominal:  ?   Palpations: Abdomen is soft.  ?   Tenderness: There is no abdominal tenderness.  ?Neurological:  ?   General: No focal deficit present.  ?   Mental Status: She is alert and oriented to person, place, and time.  ? ? ?Assessment & Plan:  ? ?1. Well controlled type 2 diabetes mellitus ([place]) ?A1c is at goal. Continue present management and monitor.  ?- POCT glycosylated hemoglobin (Hb A1C) ? ?2. Hyperlipidemia, unspecified hyperlipidemia type ?Continue present management ? ?3. Encounter to establish care ? ? ? ? ?Outpatient Encounter Medications as of 04/05/2021  ?Medication Sig  ? benzonatate (TESSALON) 100 MG capsule Take by mouth 3 (three) times daily as needed for cough.  ? Blood Glucose Monitoring Suppl (BLOOD GLUCOSE MONITOR SYSTEM) w/Device KIT Use to check blood glucose  twice daily. ICD 10 code E11.9. Dispense type covered by insurance.  ? fluticasone (FLONASE) 50 MCG/ACT nasal spray Place into both nostrils daily.  ? glucose blood test strip Use to check blood glucose twice daily. ICD 10 code E11.9. Dispense type covered by insurance.  ? Lancet Devices (LANCING DEVICE) MISC Use to check blood glucose twice daily. ICD 10 code E11.9. Dispense type covered by insurance.  ? Lancets 30G MISC Use to check blood glucose twice daily. ICD 10 code E11.9. Dispense type covered by insurance.  ? Olopatadine HCl 0.2 % SOLN Apply 1 drop to eye daily.  ? atorvastatin (LIPITOR) 10 MG tablet Take 1 tablet (10 mg total) by mouth once a week.  ? fexofenadine (ALLEGRA) 180 MG tablet Take 180 mg by mouth daily as needed for allergies.  ? FLUoxetine (PROZAC) 20 MG tablet fluoxetine 20 mg tablet  ? fluticasone (FLONASE) 50 MCG/ACT nasal spray fluticasone propionate 50 mcg/actuation nasal spray,suspension  ? HYDROcodone bit-homatropine (HYDROMET) 5-1.5 MG/5ML syrup Hydromet 5 mg-1.5 mg/5 mL oral syrup  ? pneumococcal 23 valent vaccine (PNEUMOVAX 23) 25 MCG/0.5ML injection Pneumovax-23 25 mcg/0.5 mL injection syringe  ? scopolamine (TRANSDERM SCOP, 1.5 MG,) 1 MG/3DAYS Transderm-Scop 1 mg over 3 days transdermal patch  ? ?No facility-administered encounter medications on file as of 04/05/2021.  ? ? ?Follow-up: Return in about 3 months (around 07/06/2021) for follow up.  ? ?Becky Sax, MD ? ?

## 2021-04-06 ENCOUNTER — Encounter: Payer: Self-pay | Admitting: Family Medicine

## 2021-05-11 ENCOUNTER — Encounter: Payer: 59 | Admitting: Family Medicine

## 2021-05-30 ENCOUNTER — Ambulatory Visit
Admission: RE | Admit: 2021-05-30 | Discharge: 2021-05-30 | Disposition: A | Discharge status: Home / Self Care | Payer: 59 | Source: Ambulatory Visit | Attending: Urgent Care | Admitting: Urgent Care

## 2021-05-30 VITALS — BP 144/82 | HR 68 | Temp 98.2°F | Resp 18

## 2021-05-30 DIAGNOSIS — J4521 Mild intermittent asthma with (acute) exacerbation: Secondary | ICD-10-CM | POA: Diagnosis not present

## 2021-05-30 MED ORDER — BENZONATATE 100 MG PO CAPS: 100.0000 mg | ORAL_CAPSULE | Freq: Three times a day (TID) | ORAL | 0 refills | Status: DC | PRN

## 2021-05-30 MED ORDER — MONTELUKAST SODIUM 10 MG PO TABS: 10.0000 mg | ORAL_TABLET | Freq: Every day | ORAL | 2 refills | Status: AC

## 2021-05-30 NOTE — ED Triage Notes (Signed)
Pt c/o cough, sore throat,  ? ?States she has allergies every year and these sxs are consistent with previous episodes.  ?

## 2021-05-30 NOTE — Discharge Instructions (Signed)
Your symptoms are consistent with reactive airway disease which is like an allergy in the chest. ?Continue your allegra ?Add montelukast at night.  ?Tessalon as needed for cough ?Plain mucinex (guaifenesin) to help break up the mucous ?If symptoms persist >14 days, RTC for recheck ?

## 2021-06-01 NOTE — ED Provider Notes (Signed)
[facility]    CSN: [id number] Arrival date & time: 05/30/21  1449      History   Chief Complaint Chief Complaint  Patient presents with   Cough    Allergy cough. Muscles hurt from coughing so much. I would like to get all my allergy meds refilled. - Entered by patient    HPI Chelsea Mckay is a 61 y.o. female.   The history is provided by the patient.  Cough Cough characteristics:  Dry, non-productive and hacking Sputum characteristics:  Nondescript Severity:  Moderate Onset quality:  Sudden Duration:  4 days Timing:  Intermittent Progression:  Worsening Chronicity:  Recurrent Smoker: no   Context: exposure to allergens and weather changes   Context comment:  Has hx of allergies and asthma, admits this is similar to prior years Relieved by:  Leukotriene antagonist and cough suppressants Worsened by:  Environmental changes Ineffective treatments:  Fluids and steam Associated symptoms: myalgias and sore throat   Associated symptoms: no chest pain, no chills, no ear fullness, no ear pain, no eye discharge, no shortness of breath, no sinus congestion and no wheezing   Associated symptoms comment:  Rib pain from coughing, denies body aches Sore throat from drainage  Past Medical History:  Diagnosis Date   Allergy    Anxiety    Asthma    Asthmatic bronchitis    Chest pressure    Colon polyp 04/16/2017   Done 04/10/17. 1mm polyp, transverse colon. Follow up cscopy in 5 years   Depression    Headache(784.0)    Barometric pressure migraine headaches   Kidney stones     Patient Active Problem List   Diagnosis Date Noted   Colon cancer screening 04/05/2021   Newly diagnosed diabetes (HCC) 05/09/2017   Colon polyp 04/16/2017   Postmenopausal bleeding 02/05/2017   Irregular intermenstrual bleeding 02/05/2017   Family history of breast cancer in sister 01/10/2016   Breast mass in female 12/17/2011   AR (allergic rhinitis) 11/28/2011   Benign hematuria  11/28/2011   Hyperglycemia 11/28/2011   Abnormal cervical Papanicolaou smear 04/15/2010    Past Surgical History:  Procedure Laterality Date   BUNIONECTOMY  01/14/1997   CERVICAL POLYPECTOMY  01/14/2005   COLONOSCOPY     COLONOSCOPY W/ POLYPECTOMY     DILATATION & CURETTAGE/HYSTEROSCOPY WITH TRUECLEAR N/A 09/24/2013   Procedure: DILATATION & CURETTAGE/HYSTEROSCOPY WITH TRUCLEAR and Endometrial Ablation;  Surgeon: Michael Litter, MD;  Location: [facility];  Service: Gynecology;  Laterality: N/A;   TUBAL LIGATION  01/14/1985    OB History   No obstetric history on file.      Home Medications    Prior to Admission medications   Medication Sig Start Date End Date Taking? Authorizing Provider  fexofenadine (ALLEGRA) 180 MG tablet Take 180 mg by mouth daily.   Yes [provider]  montelukast (SINGULAIR) 10 MG tablet Take 1 tablet (10 mg total) by mouth at bedtime. 05/30/21  Yes Vann Okerlund L, PA  benzonatate (TESSALON) 100 MG capsule Take 1 capsule (100 mg total) by mouth 3 (three) times daily as needed for cough. 05/30/21   Herbert Marken L, PA  Blood Glucose Monitoring Suppl (BLOOD GLUCOSE MONITOR SYSTEM) w/Device KIT Use to check blood glucose twice daily. ICD 10 code E11.9. Dispense type covered by insurance. 03/26/17   Collie Siad A, MD  glucose blood test strip Use to check blood glucose twice daily. ICD 10 code E11.9. Dispense type covered by insurance. 03/26/17  Forrest Moron, MD  Lancet Devices (LANCING DEVICE) MISC Use to check blood glucose twice daily. ICD 10 code E11.9. Dispense type covered by insurance. 03/26/17   Forrest Moron, MD  Lancets 30G MISC Use to check blood glucose twice daily. ICD 10 code E11.9. Dispense type covered by insurance. 03/26/17   Forrest Moron, MD  pneumococcal 23 valent vaccine (PNEUMOVAX 23) 25 MCG/0.5ML injection Pneumovax-23 25 mcg/0.5 mL injection syringe    [provider]    Family History Family History  Problem  Relation Age of Onset   Diabetes Mother    Alzheimer's disease Mother    Cancer Sister    Diabetes Brother    Heart disease Father    Cancer Sister        breast ca   Breast cancer Sister 72   Diabetes Sister    Hypertension Sister    Breast cancer Sister    Cancer Sister    Cancer Sister        breast ca   Breast cancer Sister 31   Diabetes Brother    Cancer Brother        prostate   Cancer Brother 57       prostate ca    Social History Social History   Tobacco Use   Smoking status: Never   Smokeless tobacco: Never  Substance Use Topics   Alcohol use: Yes    Comment: rare   Drug use: Never     Allergies   Wound dressing adhesive, Adhesive [tape], and Ciprofloxacin   Review of Systems Review of Systems  Constitutional:  Negative for chills.  HENT:  Positive for sore throat. Negative for ear pain.   Eyes:  Negative for discharge.  Respiratory:  Positive for cough. Negative for shortness of breath and wheezing.   Cardiovascular:  Negative for chest pain.  Musculoskeletal:  Positive for myalgias.    Physical Exam Triage Vital Signs ED Triage Vitals [05/30/21 1518]  Enc Vitals Group     BP (!) 144/82     Pulse Rate 68     Resp 18     Temp 98.2 F (36.8 C)     Temp Source Oral     SpO2 98 %     Weight      Height      Head Circumference      Peak Flow      Pain Score 0     Pain Loc      Pain Edu?      Excl. in [place]?    No data found.  Updated Vital Signs BP (!) 144/82 (BP Location: Left Arm)   Pulse 68   Temp 98.2 F (36.8 C) (Oral)   Resp 18   SpO2 98%   Visual Acuity Right Eye Distance:   Left Eye Distance:   Bilateral Distance:    Right Eye Near:   Left Eye Near:    Bilateral Near:     Physical Exam Vitals and nursing note reviewed.  Constitutional:      General: She is not in acute distress.    Appearance: Normal appearance. She is well-developed. She is not ill-appearing, toxic-appearing or diaphoretic.  HENT:     Head:  Normocephalic and atraumatic.     Right Ear: Tympanic membrane, ear canal and external ear normal. There is no impacted cerumen.     Left Ear: Tympanic membrane, ear canal and external ear normal. There is no impacted cerumen.  Nose: Nose normal. No congestion or rhinorrhea.     Mouth/Throat:     Mouth: Mucous membranes are moist.     Pharynx: Oropharynx is clear. No oropharyngeal exudate or posterior oropharyngeal erythema.  Eyes:     General: No scleral icterus.       Right eye: No discharge.        Left eye: No discharge.     Extraocular Movements: Extraocular movements intact.     Conjunctiva/sclera: Conjunctivae normal.     Pupils: Pupils are equal, round, and reactive to light.  Cardiovascular:     Rate and Rhythm: Normal rate and regular rhythm.     Heart sounds: No murmur heard. Pulmonary:     Effort: Pulmonary effort is normal. No accessory muscle usage, respiratory distress or retractions.     Breath sounds: Normal breath sounds and air entry. No stridor, decreased air movement or transmitted upper airway sounds. No decreased breath sounds, wheezing, rhonchi or rales.  Abdominal:     Palpations: Abdomen is soft.     Tenderness: There is no abdominal tenderness.  Musculoskeletal:        General: No swelling.     Cervical back: Normal range of motion and neck supple. No rigidity or tenderness.  Lymphadenopathy:     Cervical: No cervical adenopathy.  Skin:    General: Skin is warm and dry.     Capillary Refill: Capillary refill takes less than 2 seconds.  Neurological:     Mental Status: She is alert.  Psychiatric:        Mood and Affect: Mood normal.     UC Treatments / Results  Labs (all labs ordered are listed, but only abnormal results are displayed) Labs Reviewed - No data to display  EKG   Radiology No results found.  Procedures Procedures (including critical care time)  Medications Ordered in UC Medications - No data to display  Initial  Impression / Assessment and Plan / UC Course  I have reviewed the triage vital signs and the nursing notes.  Pertinent labs & imaging results that were available during my care of the patient were reviewed by me and considered in my medical decision making (see chart for details).     Mild RAD with exacerbation - will refill Rx that has worked in the past. Museum/gallery curator, prn tessalon. Will start nightly Montelukast, consider adding mucinex. RTC if shortness of breath or fever occurs.  Final Clinical Impressions(s) / UC Diagnoses   Final diagnoses:  Mild intermittent reactive airway disease with acute exacerbation     Discharge Instructions      Your symptoms are consistent with reactive airway disease which is like an allergy in the chest. Continue your allegra Add montelukast at night.  Tessalon as needed for cough Plain mucinex (guaifenesin) to help break up the mucous If symptoms persist >14 days, RTC for recheck   ED Prescriptions     Medication Sig Dispense Auth. Provider   benzonatate (TESSALON) 100 MG capsule Take 1 capsule (100 mg total) by mouth 3 (three) times daily as needed for cough. 30 capsule Quintin Hjort L, PA   montelukast (SINGULAIR) 10 MG tablet Take 1 tablet (10 mg total) by mouth at bedtime. 30 tablet Starlin Steib L, [place]      PDMP not reviewed this encounter.   Chaney Malling, [place] 06/01/21 2352

## 2021-07-09 ENCOUNTER — Encounter: Payer: Self-pay | Admitting: Family Medicine

## 2021-07-09 NOTE — Telephone Encounter (Signed)
FYI

## 2021-07-11 ENCOUNTER — Encounter: Payer: Self-pay | Admitting: Family Medicine

## 2021-07-11 ENCOUNTER — Ambulatory Visit (INDEPENDENT_AMBULATORY_CARE_PROVIDER_SITE_OTHER): Payer: 59 | Admitting: Family Medicine

## 2021-07-11 VITALS — BP 115/74 | HR 70 | Temp 98.1°F | Resp 16 | Ht 65.0 in | Wt 165.4 lb

## 2021-07-11 DIAGNOSIS — F341 Dysthymic disorder: Secondary | ICD-10-CM

## 2021-07-11 DIAGNOSIS — E119 Type 2 diabetes mellitus without complications: Secondary | ICD-10-CM | POA: Diagnosis not present

## 2021-07-11 LAB — POCT GLYCOSYLATED HEMOGLOBIN (HGB A1C): Hemoglobin A1C: 6.6 % — AB (ref 4.0–5.6)

## 2021-07-11 MED ORDER — MIRTAZAPINE 15 MG PO TABS: 15.0000 mg | ORAL_TABLET | Freq: Every day | ORAL | 1 refills | Status: DC

## 2021-07-11 NOTE — Progress Notes (Unsigned)
New Patient Office Visit  Subjective    Patient ID: Chelsea Mckay, female    DOB: 1960/08/03  Age: 61 y.o. MRN: [id number]  CC:  Chief Complaint  Patient presents with   Annual Exam    HPI Chelsea Mckay presents to establish care ***  Outpatient Encounter Medications as of 07/11/2021  Medication Sig   benzonatate (TESSALON) 100 MG capsule Take 1 capsule (100 mg total) by mouth 3 (three) times daily as needed for cough.   Blood Glucose Monitoring Suppl (BLOOD GLUCOSE MONITOR SYSTEM) w/Device KIT Use to check blood glucose twice daily. ICD 10 code E11.9. Dispense type covered by insurance.   fexofenadine (ALLEGRA) 180 MG tablet Take 180 mg by mouth daily.   glucose blood test strip Use to check blood glucose twice daily. ICD 10 code E11.9. Dispense type covered by insurance.   Lancet Devices (LANCING DEVICE) MISC Use to check blood glucose twice daily. ICD 10 code E11.9. Dispense type covered by insurance.   Lancets 30G MISC Use to check blood glucose twice daily. ICD 10 code E11.9. Dispense type covered by insurance.   montelukast (SINGULAIR) 10 MG tablet Take 1 tablet (10 mg total) by mouth at bedtime.   pneumococcal 23 valent vaccine (PNEUMOVAX 23) 25 MCG/0.5ML injection Pneumovax-23 25 mcg/0.5 mL injection syringe   No facility-administered encounter medications on file as of 07/11/2021.    Past Medical History:  Diagnosis Date   Allergy    Anxiety    Asthma    Asthmatic bronchitis    Chest pressure    Colon polyp 04/16/2017   Done 04/10/17. 36mm polyp, transverse colon. Follow up cscopy in 5 years   Depression    Headache(784.0)    Barometric pressure migraine headaches   Kidney stones     Past Surgical History:  Procedure Laterality Date   BUNIONECTOMY  01/14/1997   CERVICAL POLYPECTOMY  01/14/2005   COLONOSCOPY     COLONOSCOPY W/ POLYPECTOMY     DILATATION & CURETTAGE/HYSTEROSCOPY WITH TRUECLEAR N/A 09/24/2013   Procedure: DILATATION & CURETTAGE/HYSTEROSCOPY  WITH TRUCLEAR and Endometrial Ablation;  Surgeon: Betsy Coder, MD;  Location: [facility];  Service: Gynecology;  Laterality: N/A;   TUBAL LIGATION  01/14/1985    Family History  Problem Relation Age of Onset   Diabetes Mother    Alzheimer's disease Mother    Cancer Sister    Diabetes Brother    Heart disease Father    Cancer Sister        breast ca   Breast cancer Sister 36   Diabetes Sister    Hypertension Sister    Breast cancer Sister    Cancer Sister    Cancer Sister        breast ca   Breast cancer Sister 49   Diabetes Brother    Cancer Brother        prostate   Cancer Brother 41       prostate ca    Social History   Socioeconomic History   Marital status: Widowed    Spouse name: Shawn   Number of children: 3   Years of education: 12   Highest education level: Not on file  Occupational History   Occupation: Production designer, theatre/television/film    Employer: [employer]  Tobacco Use   Smoking status: Never   Smokeless tobacco: Never  Substance and Sexual Activity   Alcohol use: Yes    Comment: Very seldom drink alcohol. Maybe 2-3 times/year  Drug use: Never   Sexual activity: Yes    Partners: Male    Birth control/protection: Post-menopausal  Other Topics Concern   Not on file  Social History Narrative   Lives alone. Husband died unexpectedly of a heart attack at work 11/15/2011. Good support from pastor and daughters.   Social Determinants of Health   Financial Resource Strain: Not on file  Food Insecurity: Not on file  Transportation Needs: Not on file  Physical Activity: Not on file  Stress: Not on file  Social Connections: Not on file  Intimate Partner Violence: Not on file    ROS      Objective    BP 115/74   Pulse 70   Temp 98.1 F (36.7 C) (Oral)   Resp 16   Ht $R'5\' 5"'KT$  (1.651 m)   Wt 165 lb 6.4 oz (75 kg)   SpO2 98%   BMI 27.52 kg/m   Physical Exam  {Labs (Optional):23779}    Assessment & Plan:   Problem List Items  Addressed This Visit   None Visit Diagnoses     Well controlled type 2 diabetes mellitus ([place])    -  Primary   Relevant Orders   POCT glycosylated hemoglobin (Hb A1C)       No follow-ups on file.   Becky Sax, MD

## 2021-07-16 DIAGNOSIS — R739 Hyperglycemia, unspecified: Secondary | ICD-10-CM | POA: Insufficient documentation

## 2021-08-07 ENCOUNTER — Encounter: Payer: Self-pay | Admitting: Family Medicine

## 2021-08-07 ENCOUNTER — Ambulatory Visit (INDEPENDENT_AMBULATORY_CARE_PROVIDER_SITE_OTHER): Payer: 59 | Admitting: Family Medicine

## 2021-08-07 VITALS — BP 137/88 | HR 65 | Temp 98.1°F | Resp 16 | Ht 65.0 in | Wt 167.8 lb

## 2021-08-07 DIAGNOSIS — Z23 Encounter for immunization: Secondary | ICD-10-CM

## 2021-08-07 DIAGNOSIS — Z0000 Encounter for general adult medical examination without abnormal findings: Secondary | ICD-10-CM | POA: Diagnosis not present

## 2021-08-07 DIAGNOSIS — Z13220 Encounter for screening for lipoid disorders: Secondary | ICD-10-CM

## 2021-08-07 DIAGNOSIS — Z130 Encounter for screening for diseases of the blood and blood-forming organs and certain disorders involving the immune mechanism: Secondary | ICD-10-CM

## 2021-08-07 MED ORDER — FLUOXETINE HCL 10 MG PO TABS: 10.0000 mg | ORAL_TABLET | Freq: Every day | ORAL | 0 refills | Status: DC

## 2021-08-07 NOTE — Progress Notes (Unsigned)
Patient was here for complete physical examination Patient as no concerns for provider today  Patient was given shingrix 2

## 2021-08-08 LAB — TSH: TSH: 1.25 u[IU]/mL (ref 0.450–4.500)

## 2021-08-08 LAB — CBC WITH DIFFERENTIAL/PLATELET
Basophils Absolute: 0 10*3/uL (ref 0.0–0.2)
Basos: 0 %
EOS (ABSOLUTE): 0 10*3/uL (ref 0.0–0.4)
Eos: 1 %
Hematocrit: 42.5 % (ref 34.0–46.6)
Hemoglobin: 14.6 g/dL (ref 11.1–15.9)
Immature Grans (Abs): 0 10*3/uL (ref 0.0–0.1)
Immature Granulocytes: 0 %
Lymphocytes Absolute: 2.3 10*3/uL (ref 0.7–3.1)
Lymphs: 51 %
MCH: 31.9 pg (ref 26.6–33.0)
MCHC: 34.4 g/dL (ref 31.5–35.7)
MCV: 93 fL (ref 79–97)
Monocytes Absolute: 0.4 10*3/uL (ref 0.1–0.9)
Monocytes: 8 %
Neutrophils Absolute: 1.8 10*3/uL (ref 1.4–7.0)
Neutrophils: 40 %
Platelets: 212 10*3/uL (ref 150–450)
RBC: 4.58 x10E6/uL (ref 3.77–5.28)
RDW: 12.4 % (ref 11.7–15.4)
WBC: 4.4 10*3/uL (ref 3.4–10.8)

## 2021-08-08 LAB — CMP14+EGFR
ALT: 19 IU/L (ref 0–32)
AST: 18 IU/L (ref 0–40)
Albumin/Globulin Ratio: 1.7 (ref 1.2–2.2)
Albumin: 4.6 g/dL (ref 3.9–4.9)
Alkaline Phosphatase: 79 IU/L (ref 44–121)
BUN/Creatinine Ratio: 22 (ref 12–28)
BUN: 21 mg/dL (ref 8–27)
Bilirubin Total: 0.7 mg/dL (ref 0.0–1.2)
CO2: 23 mmol/L (ref 20–29)
Calcium: 9.8 mg/dL (ref 8.7–10.3)
Chloride: 104 mmol/L (ref 96–106)
Creatinine, Ser: 0.97 mg/dL (ref 0.57–1.00)
Globulin, Total: 2.7 g/dL (ref 1.5–4.5)
Glucose: 128 mg/dL — ABNORMAL HIGH (ref 70–99)
Potassium: 4.5 mmol/L (ref 3.5–5.2)
Sodium: 143 mmol/L (ref 134–144)
Total Protein: 7.3 g/dL (ref 6.0–8.5)
eGFR: 66 mL/min/{1.73_m2} (ref >59)

## 2021-08-08 LAB — MICROALBUMIN / CREATININE URINE RATIO
Creatinine, Urine: 73 mg/dL
Microalb/Creat Ratio: 20 mg/g creat (ref 0–29)
Microalbumin, Urine: 14.9 ug/mL

## 2021-08-08 LAB — LIPID PANEL
Chol/HDL Ratio: 3.7 ratio (ref 0.0–4.4)
Cholesterol, Total: 222 mg/dL — ABNORMAL HIGH (ref 100–199)
HDL: 60 mg/dL (ref >39)
LDL Chol Calc (NIH): 142 mg/dL — ABNORMAL HIGH (ref 0–99)
Triglycerides: 111 mg/dL (ref 0–149)
VLDL Cholesterol Cal: 20 mg/dL (ref 5–40)

## 2021-08-08 LAB — VITAMIN D 25 HYDROXY (VIT D DEFICIENCY, FRACTURES): Vit D, 25-Hydroxy: 29.1 ng/mL — ABNORMAL LOW (ref 30.0–100.0)

## 2021-08-09 ENCOUNTER — Encounter: Payer: Self-pay | Admitting: Family Medicine

## 2021-08-09 ENCOUNTER — Other Ambulatory Visit: Payer: Self-pay | Admitting: Family Medicine

## 2021-08-09 MED ORDER — ATORVASTATIN CALCIUM 10 MG PO TABS: 10.0000 mg | ORAL_TABLET | Freq: Every day | ORAL | 0 refills | Status: DC

## 2021-08-09 MED ORDER — VITAMIN D (ERGOCALCIFEROL) 1.25 MG (50000 UNIT) PO CAPS: 50000.0000 [IU] | ORAL_CAPSULE | ORAL | 0 refills | Status: DC

## 2021-08-09 NOTE — Progress Notes (Signed)
Established Patient Office Visit  Subjective    Patient ID: Chelsea Mckay, female    DOB: [birth year]  Age: 61 y.o. MRN: [id number]  CC:  Chief Complaint  Patient presents with   Annual Exam    HPI Chelsea Mckay presents for routine annual exam. Patient denies acute complaints or concerns.    Outpatient Encounter Medications as of 08/07/2021  Medication Sig   Blood Glucose Monitoring Suppl (BLOOD GLUCOSE MONITOR SYSTEM) w/Device KIT Use to check blood glucose twice daily. ICD 10 code E11.9. Dispense type covered by insurance.   fexofenadine (ALLEGRA) 180 MG tablet Take 180 mg by mouth daily.   FLUoxetine (PROZAC) 10 MG tablet Take 1 tablet (10 mg total) by mouth daily.   glucose blood test strip Use to check blood glucose twice daily. ICD 10 code E11.9. Dispense type covered by insurance.   Lancet Devices (LANCING DEVICE) MISC Use to check blood glucose twice daily. ICD 10 code E11.9. Dispense type covered by insurance.   Lancets 30G MISC Use to check blood glucose twice daily. ICD 10 code E11.9. Dispense type covered by insurance.   mirtazapine (REMERON) 15 MG tablet Take 1 tablet (15 mg total) by mouth at bedtime.   montelukast (SINGULAIR) 10 MG tablet Take 1 tablet (10 mg total) by mouth at bedtime.   pneumococcal 23 valent vaccine (PNEUMOVAX 23) 25 MCG/0.5ML injection Pneumovax-23 25 mcg/0.5 mL injection syringe   [DISCONTINUED] benzonatate (TESSALON) 100 MG capsule Take 1 capsule (100 mg total) by mouth 3 (three) times daily as needed for cough.   No facility-administered encounter medications on file as of 08/07/2021.    Past Medical History:  Diagnosis Date   Allergy    Anxiety    Asthma    Asthmatic bronchitis    Chest pressure    Colon polyp 04/16/2017   Done 04/10/17. 77mm polyp, transverse colon. Follow up cscopy in 5 years   Depression    Headache(784.0)    Barometric pressure migraine headaches   Kidney stones     Past Surgical History:  Procedure  Laterality Date   BUNIONECTOMY  01/14/1997   CERVICAL POLYPECTOMY  01/14/2005   COLONOSCOPY     COLONOSCOPY W/ POLYPECTOMY     DILATATION & CURETTAGE/HYSTEROSCOPY WITH TRUECLEAR N/A 09/24/2013   Procedure: DILATATION & CURETTAGE/HYSTEROSCOPY WITH TRUCLEAR and Endometrial Ablation;  Surgeon: Betsy Coder, MD;  Location: [facility];  Service: Gynecology;  Laterality: N/A;   TUBAL LIGATION  01/14/1985    Family History  Problem Relation Age of Onset   Diabetes Mother    Alzheimer's disease Mother    Cancer Sister    Diabetes Brother    Heart disease Father    Cancer Sister        breast ca   Breast cancer Sister 79   Diabetes Sister    Hypertension Sister    Breast cancer Sister    Cancer Sister    Cancer Sister        breast ca   Breast cancer Sister 56   Diabetes Brother    Cancer Brother        prostate   Cancer Brother 34       prostate ca    Social History   Socioeconomic History   Marital status: Widowed    Spouse name: Shawn   Number of children: 3   Years of education: 12   Highest education level: Not on file  Occupational History   Occupation: Production designer, theatre/television/film  Employer: [employer]  Tobacco Use   Smoking status: Never   Smokeless tobacco: Never  Substance and Sexual Activity   Alcohol use: Yes    Comment: Very seldom drink alcohol. Maybe 2-3 times/year   Drug use: Never   Sexual activity: Yes    Partners: Male    Birth control/protection: Post-menopausal  Other Topics Concern   Not on file  Social History Narrative   Lives alone. Husband died unexpectedly of a heart attack at work 11/15/2011. Good support from pastor and daughters.   Social Determinants of Health   Financial Resource Strain: Not on file  Food Insecurity: Not on file  Transportation Needs: Not on file  Physical Activity: Not on file  Stress: Not on file  Social Connections: Not on file  Intimate Partner Violence: Not on file    Review of Systems  All  other systems reviewed and are negative.       Objective    BP 137/88   Pulse 65   Temp 98.1 F (36.7 C) (Oral)   Resp 16   Ht $R'5\' 5"'jZ$  (1.651 m)   Wt 167 lb 12.8 oz (76.1 kg)   SpO2 98%   BMI 27.92 kg/m   Physical Exam Vitals and nursing note reviewed.  Constitutional:      General: She is not in acute distress. HENT:     Head: Normocephalic and atraumatic.     Right Ear: Tympanic membrane, ear canal and external ear normal.     Left Ear: Tympanic membrane, ear canal and external ear normal.     Nose: Nose normal.     Mouth/Throat:     Mouth: Mucous membranes are moist.     Pharynx: Oropharynx is clear.  Eyes:     Conjunctiva/sclera: Conjunctivae normal.     Pupils: Pupils are equal, round, and reactive to light.  Neck:     Thyroid: No thyromegaly.  Cardiovascular:     Rate and Rhythm: Normal rate and regular rhythm.     Heart sounds: Normal heart sounds. No murmur heard. Pulmonary:     Effort: Pulmonary effort is normal. No respiratory distress.     Breath sounds: Normal breath sounds.  Abdominal:     General: There is no distension.     Palpations: Abdomen is soft. There is no mass.     Tenderness: There is no abdominal tenderness.  Musculoskeletal:        General: Normal range of motion.     Cervical back: Normal range of motion and neck supple.  Skin:    General: Skin is warm and dry.  Neurological:     General: No focal deficit present.     Mental Status: She is alert and oriented to person, place, and time.  Psychiatric:        Mood and Affect: Mood normal.        Behavior: Behavior normal.         Assessment & Plan:   1. Annual physical exam  - Microalbumin / creatinine urine ratio - CMP14+EGFR  2. Need for shingles vaccine  - Varicella-zoster vaccine IM  3. Screening for deficiency anemia  - CBC with Differential  4. Screening for lipid disorders  - Lipid Panel  5. Screening for endocrine/metabolic/immunity disorders  - Vitamin  D, 25-hydroxy - TSH    No follow-ups on file.   Becky Sax, MD

## 2021-08-10 ENCOUNTER — Encounter: Payer: Self-pay | Admitting: *Deleted

## 2021-08-20 ENCOUNTER — Telehealth: Payer: Self-pay | Admitting: *Deleted

## 2021-08-20 NOTE — Telephone Encounter (Signed)
Call  place to patient plan for PA.  I did get the medication approved for 1year.  [id number]

## 2021-09-04 ENCOUNTER — Encounter: Payer: Self-pay | Admitting: Family Medicine

## 2021-09-04 NOTE — Telephone Encounter (Signed)
Patient request

## 2021-09-11 ENCOUNTER — Other Ambulatory Visit: Payer: Self-pay | Admitting: Family Medicine

## 2021-09-11 MED ORDER — SCOPOLAMINE 1 MG/3DAYS TD PT72
1.0000 | MEDICATED_PATCH | TRANSDERMAL | 0 refills | Status: AC
Start: 2021-09-11 — End: ?

## 2021-11-02 ENCOUNTER — Other Ambulatory Visit: Payer: Self-pay | Admitting: *Deleted

## 2021-11-02 ENCOUNTER — Telehealth: Payer: Self-pay | Admitting: Family Medicine

## 2021-11-02 MED ORDER — ATORVASTATIN CALCIUM 10 MG PO TABS: 10.0000 mg | ORAL_TABLET | Freq: Every day | ORAL | 0 refills | Status: DC

## 2021-11-02 NOTE — Telephone Encounter (Signed)
Patient called stating that she needed to schedule her 3 month follow up for A1c check and cholesterol check. She also only has 9 of her Atorvastatin left. We were not able to get her in until Nov. 2 and she will be out of her medication. Can a month's worth be sent in until she gets her in Nov.?

## 2021-11-02 NOTE — Telephone Encounter (Signed)
Medication has been set to pharmacy

## 2021-11-04 ENCOUNTER — Other Ambulatory Visit: Payer: Self-pay | Admitting: Family Medicine

## 2021-11-08 LAB — HM DIABETES EYE EXAM

## 2021-11-15 ENCOUNTER — Ambulatory Visit: Payer: 59 | Admitting: Family Medicine

## 2021-11-15 ENCOUNTER — Encounter: Payer: Self-pay | Admitting: Family Medicine

## 2021-11-15 VITALS — BP 133/83 | HR 64 | Temp 98.1°F | Resp 16 | Wt 169.4 lb

## 2021-11-15 DIAGNOSIS — F341 Dysthymic disorder: Secondary | ICD-10-CM

## 2021-11-15 DIAGNOSIS — E1165 Type 2 diabetes mellitus with hyperglycemia: Secondary | ICD-10-CM

## 2021-11-15 DIAGNOSIS — E785 Hyperlipidemia, unspecified: Secondary | ICD-10-CM

## 2021-11-15 LAB — POCT GLYCOSYLATED HEMOGLOBIN (HGB A1C): Hemoglobin A1C: 7.2 % — AB (ref 4.0–5.6)

## 2021-11-15 MED ORDER — METFORMIN HCL ER 750 MG PO TB24: 750.0000 mg | ORAL_TABLET | Freq: Every day | ORAL | 0 refills | Status: DC

## 2021-11-15 NOTE — Progress Notes (Signed)
Established Patient Office Visit  Subjective    Patient ID: Chelsea Mckay, female    DOB: 1960/12/08  Age: 61 y.o. MRN: [id number]  CC:  Chief Complaint  Patient presents with   Follow-up    3 months    HPI Chelsea Mckay presents for routine follow up of chronic med issues. Patient denies acute complaints or concerns.    Outpatient Encounter Medications as of 11/15/2021  Medication Sig   atorvastatin (LIPITOR) 10 MG tablet Take 1 tablet (10 mg total) by mouth daily.   Blood Glucose Monitoring Suppl (BLOOD GLUCOSE MONITOR SYSTEM) w/Device KIT Use to check blood glucose twice daily. ICD 10 code E11.9. Dispense type covered by insurance.   fexofenadine (ALLEGRA) 180 MG tablet Take 180 mg by mouth daily.   FLUoxetine (PROZAC) 10 MG tablet Take 1 tablet (10 mg total) by mouth daily.   glucose blood test strip Use to check blood glucose twice daily. ICD 10 code E11.9. Dispense type covered by insurance.   Lancet Devices (LANCING DEVICE) MISC Use to check blood glucose twice daily. ICD 10 code E11.9. Dispense type covered by insurance.   Lancets 30G MISC Use to check blood glucose twice daily. ICD 10 code E11.9. Dispense type covered by insurance.   metFORMIN (GLUCOPHAGE-XR) 750 MG 24 hr tablet Take 1 tablet (750 mg total) by mouth daily with breakfast.   mirtazapine (REMERON) 15 MG tablet Take 1 tablet (15 mg total) by mouth at bedtime.   montelukast (SINGULAIR) 10 MG tablet Take 1 tablet (10 mg total) by mouth at bedtime.   pneumococcal 23 valent vaccine (PNEUMOVAX 23) 25 MCG/0.5ML injection Pneumovax-23 25 mcg/0.5 mL injection syringe   scopolamine (TRANSDERM-SCOP) 1 MG/3DAYS Place 1 patch (1.5 mg total) onto the skin every 3 (three) days.   Vitamin D, Ergocalciferol, (DRISDOL) 1.25 MG (50000 UNIT) CAPS capsule Take 1 capsule (50,000 Units total) by mouth every 7 (seven) days.   albuterol (PROVENTIL HFA) 108 (90 Base) MCG/ACT inhaler    azithromycin (ZITHROMAX) 250 MG tablet     clotrimazole-betamethasone (LOTRISONE) cream APPLY TO THE AFFECTED AND SURROUNDING AREAS OF SKIN BY TOPICAL ROUTE 2 TIMES PER DAY IN THE MORNING AND EVENING FOR 2 WEEKS   HYDROcodone bit-homatropine (HYDROMET) 5-1.5 MG/5ML syrup    ibuprofen (ADVIL) 600 MG tablet    influenza vac split quadrivalent PF (FLUARIX) 0.5 ML injection    medroxyPROGESTERone (PROVERA) 10 MG tablet TK 1 T PO QD FOR 7 DAYS   naproxen (NAPROSYN) 500 MG tablet    predniSONE (DELTASONE) 20 MG tablet    No facility-administered encounter medications on file as of 11/15/2021.    Past Medical History:  Diagnosis Date   Allergy    Anxiety    Asthma    Asthmatic bronchitis    Chest pressure    Colon polyp 04/16/2017   Done 04/10/17. 89m polyp, transverse colon. Follow up cscopy in 5 years   Depression    Headache(784.0)    Barometric pressure migraine headaches   Kidney stones     Past Surgical History:  Procedure Laterality Date   BUNIONECTOMY  01/14/1997   CERVICAL POLYPECTOMY  01/14/2005   COLONOSCOPY     COLONOSCOPY W/ POLYPECTOMY     DILATATION & CURETTAGE/HYSTEROSCOPY WITH TRUECLEAR N/A 09/24/2013   Procedure: DILATATION & CURETTAGE/HYSTEROSCOPY WITH TRUCLEAR and Endometrial Ablation;  Surgeon: NBetsy Coder MD;  [facility];  Service: Gynecology;  Laterality: N/A;   TUBAL LIGATION  01/14/1985    Family History  Problem Relation Age of Onset   Diabetes Mother    Alzheimer's disease Mother    Cancer Sister    Diabetes Brother    Heart disease Father    Cancer Sister        breast ca   Breast cancer Sister 31   Diabetes Sister    Hypertension Sister    Breast cancer Sister    Cancer Sister    Cancer Sister        breast ca   Breast cancer Sister 73   Diabetes Brother    Cancer Brother        prostate   Cancer Brother 96       prostate ca    Social History   Socioeconomic History   Marital status: Widowed    Spouse name: Shawn   Number of children: 3   Years of education: 12    Highest education level: Not on file  Occupational History   Occupation: Production designer, theatre/television/film    Employer: [employer]  Tobacco Use   Smoking status: Never   Smokeless tobacco: Never  Substance and Sexual Activity   Alcohol use: Yes    Comment: Very seldom drink alcohol. Maybe 2-3 times/year   Drug use: Never   Sexual activity: Yes    Partners: Male    Birth control/protection: Post-menopausal  Other Topics Concern   Not on file  Social History Narrative   Lives alone. Husband died unexpectedly of a heart attack at work 11/15/2011. Good support from pastor and daughters.   Social Determinants of Health   Financial Resource Strain: Not on file  Food Insecurity: Not on file  Transportation Needs: Not on file  Physical Activity: Not on file  Stress: Not on file  Social Connections: Not on file  Intimate Partner Violence: Not on file    Review of Systems  All other systems reviewed and are negative.       Objective    BP 133/83   Pulse 64   Temp 98.1 F (36.7 C) (Oral)   Resp 16   Wt 169 lb 6.4 oz (76.8 kg)   SpO2 97%   BMI 28.19 kg/m   Physical Exam Vitals and nursing note reviewed.  Constitutional:      General: She is not in acute distress. Cardiovascular:     Rate and Rhythm: Normal rate and regular rhythm.  Pulmonary:     Effort: Pulmonary effort is normal.     Breath sounds: Normal breath sounds.  Abdominal:     Palpations: Abdomen is soft.     Tenderness: There is no abdominal tenderness.  Neurological:     General: No focal deficit present.     Mental Status: She is alert and oriented to person, place, and time.  Psychiatric:        Mood and Affect: Mood normal.        Behavior: Behavior normal.         Assessment & Plan:   1. Type 2 diabetes mellitus with hyperglycemia, without long-term current use of insulin (HCC) Increased A1c and no longer at goal. Will start metformin XR 750 mg daily and monitor - POCT glycosylated  hemoglobin (Hb A1C) - Lipid Panel - AST - ALT  2. Hyperlipidemia, unspecified hyperlipidemia type Continue   3. Dysthymia Appears stable. Continue present management.    Return in about 3 months (around 02/15/2022) for follow up.   Becky Sax, MD

## 2021-11-16 LAB — LIPID PANEL
Chol/HDL Ratio: 2.5 ratio (ref 0.0–4.4)
Cholesterol, Total: 148 mg/dL (ref 100–199)
HDL: 60 mg/dL (ref >39)
LDL Chol Calc (NIH): 71 mg/dL (ref 0–99)
Triglycerides: 89 mg/dL (ref 0–149)
VLDL Cholesterol Cal: 17 mg/dL (ref 5–40)

## 2021-11-16 LAB — AST: AST: 19 IU/L (ref 0–40)

## 2021-11-16 LAB — ALT: ALT: 28 IU/L (ref 0–32)

## 2021-11-27 ENCOUNTER — Other Ambulatory Visit: Payer: Self-pay | Admitting: Family Medicine

## 2021-12-03 ENCOUNTER — Other Ambulatory Visit: Payer: Self-pay | Admitting: *Deleted

## 2021-12-15 ENCOUNTER — Other Ambulatory Visit: Payer: Self-pay | Admitting: Family Medicine

## 2021-12-17 MED ORDER — ATORVASTATIN CALCIUM 10 MG PO TABS: 10.0000 mg | ORAL_TABLET | Freq: Every day | ORAL | 0 refills | Status: DC

## 2022-01-08 ENCOUNTER — Encounter: Payer: Self-pay | Admitting: Family Medicine

## 2022-01-12 ENCOUNTER — Other Ambulatory Visit: Payer: Self-pay | Admitting: Family Medicine

## 2022-01-13 NOTE — Telephone Encounter (Signed)
Requested Prescriptions  Pending Prescriptions Disp Refills   atorvastatin (LIPITOR) 10 MG tablet [Pharmacy Med Name: ATORVASTATIN '10MG'$  TABLETS] 90 tablet 3    Sig: TAKE 1 TABLET(10 MG) BY MOUTH DAILY     Cardiovascular:  Antilipid - Statins Failed - 01/12/2022 10:49 AM      Failed - Lipid Panel in normal range within the last 12 months    Cholesterol, Total  Date Value Ref Range Status  11/15/2021 148 100 - 199 mg/dL Final   LDL Chol Calc (NIH)  Date Value Ref Range Status  11/15/2021 71 0 - 99 mg/dL Final   HDL  Date Value Ref Range Status  11/15/2021 60 >39 mg/dL Final   Triglycerides  Date Value Ref Range Status  11/15/2021 89 0 - 149 mg/dL Final         Passed - Patient is not pregnant      Passed - Valid encounter within last 12 months    Recent Outpatient Visits           1 month ago Type 2 diabetes mellitus with hyperglycemia, without long-term current use of insulin ([place])   Primary Care at [facility], MD   5 months ago Annual physical exam   Primary Care at [facility], Clyde Canterbury, MD   6 months ago Well controlled type 2 diabetes mellitus [facility])   Primary Care at [facility], Clyde Canterbury, MD   9 months ago Well controlled type 2 diabetes mellitus [facility])   Primary Care at [facility], MD   2 years ago Encounter for physical examination   Primary [facility] at [facility], Arlie Solomons, MD       Future Appointments             In 1 month Dorna Mai, MD Primary Care at [facility]

## 2022-02-06 ENCOUNTER — Other Ambulatory Visit: Payer: Self-pay | Admitting: Family Medicine

## 2022-02-11 ENCOUNTER — Other Ambulatory Visit: Payer: Self-pay | Admitting: Family Medicine

## 2022-02-11 DIAGNOSIS — Z1231 Encounter for screening mammogram for malignant neoplasm of breast: Secondary | ICD-10-CM

## 2022-02-15 ENCOUNTER — Ambulatory Visit: Payer: 59 | Admitting: Family Medicine

## 2022-03-08 DIAGNOSIS — E119 Type 2 diabetes mellitus without complications: Secondary | ICD-10-CM | POA: Insufficient documentation

## 2022-03-08 DIAGNOSIS — E1169 Type 2 diabetes mellitus with other specified complication: Secondary | ICD-10-CM | POA: Insufficient documentation

## 2022-03-08 HISTORY — DX: Type 2 diabetes mellitus with other specified complication: E11.69

## 2022-03-12 ENCOUNTER — Ambulatory Visit: Payer: 59 | Admitting: Family Medicine

## 2022-04-01 ENCOUNTER — Telehealth: Payer: Self-pay | Admitting: Internal Medicine

## 2022-04-01 ENCOUNTER — Ambulatory Visit
Admission: RE | Admit: 2022-04-01 | Discharge: 2022-04-01 | Disposition: A | Discharge status: Home / Self Care | Payer: 59 | Source: Ambulatory Visit | Attending: Family Medicine | Admitting: Family Medicine

## 2022-04-01 DIAGNOSIS — Z1231 Encounter for screening mammogram for malignant neoplasm of breast: Secondary | ICD-10-CM

## 2022-04-01 NOTE — Telephone Encounter (Signed)
Hi Dr. Lorenso Courier,   Supervising Provider: 04/01/22 Am   We received a referral for patient to have another colonoscopy she did have one done back in 2019 with Dr, Benson Norway. Reports were obtained for you to review and advise on scheduling.    Thanks

## 2022-04-03 ENCOUNTER — Encounter: Payer: Self-pay | Admitting: Internal Medicine

## 2022-04-03 NOTE — Progress Notes (Signed)
Reviewed records from [facility]. Okay to schedule for direct colonoscopy at [facility].  Colonoscopy 04/10/17: 4 mm polyp in the transverse colon that was resected with cold snare. Excellent quality bowel prep. Path: SSA

## 2022-04-19 ENCOUNTER — Ambulatory Visit (AMBULATORY_SURGERY_CENTER): Payer: 59 | Admitting: *Deleted

## 2022-04-19 VITALS — Ht 65.0 in | Wt 163.0 lb

## 2022-04-19 DIAGNOSIS — Z86010 Personal history of colonic polyps: Secondary | ICD-10-CM

## 2022-04-19 MED ORDER — NA SULFATE-K SULFATE-MG SULF 17.5-3.13-1.6 GM/177ML PO SOLN: 1.0000 | Freq: Once | ORAL | 0 refills | Status: AC

## 2022-04-19 NOTE — Progress Notes (Signed)

## 2022-04-22 ENCOUNTER — Encounter: Payer: Self-pay | Admitting: Internal Medicine

## 2022-05-07 LAB — HM DIABETES EYE EXAM

## 2022-05-08 ENCOUNTER — Encounter: Payer: Self-pay | Admitting: Physician Assistant

## 2022-05-29 ENCOUNTER — Ambulatory Visit (AMBULATORY_SURGERY_CENTER): Payer: 59 | Admitting: Internal Medicine

## 2022-05-29 ENCOUNTER — Encounter: Payer: Self-pay | Admitting: Internal Medicine

## 2022-05-29 VITALS — BP 121/70 | HR 68 | Temp 97.5°F | Resp 16 | Ht 65.0 in | Wt 163.0 lb

## 2022-05-29 DIAGNOSIS — T753XXA Motion sickness, initial encounter: Secondary | ICD-10-CM

## 2022-05-29 DIAGNOSIS — Z09 Encounter for follow-up examination after completed treatment for conditions other than malignant neoplasm: Secondary | ICD-10-CM

## 2022-05-29 DIAGNOSIS — Z86010 Personal history of colonic polyps: Secondary | ICD-10-CM

## 2022-05-29 DIAGNOSIS — D120 Benign neoplasm of cecum: Secondary | ICD-10-CM

## 2022-05-29 DIAGNOSIS — D122 Benign neoplasm of ascending colon: Secondary | ICD-10-CM | POA: Diagnosis not present

## 2022-05-29 MED ORDER — SODIUM CHLORIDE 0.9 % IV SOLN: 500.0000 mL | Freq: Once | INTRAVENOUS | Status: DC

## 2022-05-29 MED ORDER — SODIUM CHLORIDE 0.9 % IV SOLN
4.0000 mg | Freq: Once | INTRAVENOUS | Status: AC
Start: 2022-05-29 — End: ?

## 2022-05-29 NOTE — Progress Notes (Signed)
Report to PACU, RN, vss, BBS= Clear.  

## 2022-05-29 NOTE — Progress Notes (Signed)
Vitals-DT  Pt's states no medical or surgical changes since previsit or office visit.  

## 2022-05-29 NOTE — Op Note (Signed)
[facility] Patient Name: Chelsea Mckay Procedure Date: 05/29/2022 11:16 AM MRN: [id number] Endoscopist: Particia Lather , , [id number] Age: 62 Referring MD:  Date of Birth: Apr 07, 1960 Gender: Female Account #: [id number] Procedure:                Colonoscopy Indications:              High risk colon cancer surveillance: Personal                            history of colonic polyps Medicines:                Monitored Anesthesia Care Procedure:                Pre-Anesthesia Assessment:                           - Prior to the procedure, a History and Physical                            was performed, and patient medications and                            allergies were reviewed. The patient's tolerance of                            previous anesthesia was also reviewed. The risks                            and benefits of the procedure and the sedation                            options and risks were discussed with the patient.                            All questions were answered, and informed consent                            was obtained. Prior Anticoagulants: The patient has                            taken no anticoagulant or antiplatelet agents. ASA                            Grade Assessment: II - A patient with mild systemic                            disease. After reviewing the risks and benefits,                            the patient was deemed in satisfactory condition to                            undergo the procedure.  After obtaining informed consent, the colonoscope                            was passed under direct vision. Throughout the                            procedure, the patient's blood pressure, pulse, and                            oxygen saturations were monitored continuously. The                            Olympus [id number]) Colonoscope was                            introduced through the anus and  advanced to the the                            terminal ileum. The colonoscopy was performed                            without difficulty. The patient tolerated the                            procedure well. The quality of the bowel                            preparation was good. The terminal ileum, ileocecal                            valve, appendiceal orifice, and rectum were                            photographed. Scope In: 11:27:03 AM Scope Out: 11:40:55 AM Scope Withdrawal Time: 0 hours 10 minutes 56 seconds  Total Procedure Duration: 0 hours 13 minutes 52 seconds  Findings:                 The terminal ileum appeared normal.                           Four sessile polyps were found in the ascending                            colon and cecum. The polyps were 3 to 5 mm in size.                            These polyps were removed with a cold snare.                            Resection and retrieval were complete.                           Non-bleeding internal hemorrhoids were found during  retroflexion. Complications:            No immediate complications. Estimated Blood Loss:     Estimated blood loss was minimal. Impression:               - The examined portion of the ileum was normal.                           - Four 3 to 5 mm polyps in the ascending colon and                            in the cecum, removed with a cold snare. Resected                            and retrieved.                           - Non-bleeding internal hemorrhoids. Recommendation:           - Discharge patient to home (with escort).                           - Await pathology results.                           - The findings and recommendations were discussed                            with the patient. Dr Particia Lather "[name]" Leonides Schanz,  05/29/2022 11:50:27 AM

## 2022-05-29 NOTE — Patient Instructions (Addendum)
Thank you for coming in to see [place] today! Resume your diet and medications today. Return to regular daily activities tomorrow. Biopsy results will be available in 1-2  weeks at which time your next colonoscopy will be recommended.   YOU HAD AN ENDOSCOPIC PROCEDURE TODAY AT THE [facility]:   Refer to the procedure report that was given to you for any specific questions about what was found during the examination.  If the procedure report does not answer your questions, please call your gastroenterologist to clarify.  If you requested that your care partner not be given the details of your procedure findings, then the procedure report has been included in a sealed envelope for you to review at your convenience later.  YOU SHOULD EXPECT: Some feelings of bloating in the abdomen. Passage of more gas than usual.  Walking can help get rid of the air that was put into your GI tract during the procedure and reduce the bloating. If you had a lower endoscopy (such as a colonoscopy or flexible sigmoidoscopy) you may notice spotting of blood in your stool or on the toilet paper. If you underwent a bowel prep for your procedure, you may not have a normal bowel movement for a few days.  Please Note:  You might notice some irritation and congestion in your nose or some drainage.  This is from the oxygen used during your procedure.  There is no need for concern and it should clear up in a day or so.  SYMPTOMS TO REPORT IMMEDIATELY:  Following lower endoscopy (colonoscopy or flexible sigmoidoscopy):  Excessive amounts of blood in the stool  Significant tenderness or worsening of abdominal pains  Swelling of the abdomen that is new, acute  Fever of 100F or higher   For urgent or emergent issues, a gastroenterologist can be reached at any hour by calling [phone]. Do not use MyChart messaging for urgent concerns.    DIET:  We do recommend a small meal at first, but then you may proceed to  your regular diet.  Drink plenty of fluids but you should avoid alcoholic beverages for 24 hours.  ACTIVITY:  You should plan to take it easy for the rest of today and you should NOT DRIVE or use heavy machinery until tomorrow (because of the sedation medicines used during the test).    FOLLOW UP: Our staff will call the number listed on your records the next business day following your procedure.  We will call around 7:15- 8:00 am to check on you and address any questions or concerns that you may have regarding the information given to you following your procedure. If we do not reach you, we will leave a message.     If any biopsies were taken you will be contacted by phone or by letter within the next 1-3 weeks.  Please call us at [phone] if you have not heard about the biopsies in 3 weeks.    SIGNATURES/CONFIDENTIALITY: You and/or your care partner have signed paperwork which will be entered into your electronic medical record.  These signatures attest to the fact that that the information above on your After Visit Summary has been reviewed and is understood.  Full responsibility of the confidentiality of this discharge information lies with you and/or your care-partner.

## 2022-05-29 NOTE — Progress Notes (Signed)
GASTROENTEROLOGY PROCEDURE H&P NOTE   Primary Care Physician: Nathaneil Canary, PA-C    Reason for Procedure:   History of colon polyps  Plan:    Colonoscopy  Patient is appropriate for endoscopic procedure(s) in the ambulatory ([facility]) setting.  The nature of the procedure, as well as the risks, benefits, and alternatives were carefully and thoroughly reviewed with the patient. Ample time for discussion and questions allowed. The patient understood, was satisfied, and agreed to proceed.     HPI: Chelsea Mckay is a 62 y.o. female who presents for colonoscopy for history of colon polyps. Denies blood in stools or unintentional weight loss. She has been more constipated recently. Denies family history of colon cancer.  Colonoscopy 04/10/17: 4 mm polyp in the transverse colon that was resected with cold snare. Excellent quality bowel prep. Path: SSA  Past Medical History:  Diagnosis Date   Allergy    Anxiety    Asthma    Asthmatic bronchitis    Cataract    "FORMING"   Chest pressure    Colon polyp 04/16/2017   Done 04/10/17. 4mm polyp, transverse colon. Follow up cscopy in 5 years   Depression    Headache(784.0)    Barometric pressure migraine headaches   Hyperlipidemia associated with type 2 diabetes mellitus (HCC) 03/08/2022   Kidney stones     Past Surgical History:  Procedure Laterality Date   BUNIONECTOMY  01/14/1997   CERVICAL POLYPECTOMY  01/14/2005   COLONOSCOPY     COLONOSCOPY W/ POLYPECTOMY     DILATATION & CURETTAGE/HYSTEROSCOPY WITH TRUECLEAR N/A 09/24/2013   Procedure: DILATATION & CURETTAGE/HYSTEROSCOPY WITH TRUCLEAR and Endometrial Ablation;  Surgeon: Michael Litter, MD;  Location: [facility];  Service: Gynecology;  Laterality: N/A;   TUBAL LIGATION  01/14/1985    Prior to Admission medications   Medication Sig Start Date End Date Taking? Authorizing Provider  Blood Glucose Monitoring Suppl (BLOOD GLUCOSE MONITOR SYSTEM) w/Device KIT Use to check blood  glucose twice daily. ICD 10 code E11.9. Dispense type covered by insurance. 03/26/17  Yes Stallings, Zoe A, MD  glucose blood test strip Use to check blood glucose twice daily. ICD 10 code E11.9. Dispense type covered by insurance. 03/26/17  Yes Doristine Bosworth, MD  Lancet Devices (LANCING DEVICE) MISC Use to check blood glucose twice daily. ICD 10 code E11.9. Dispense type covered by insurance. 03/26/17  Yes Collie Siad A, MD  Lancets 30G MISC Use to check blood glucose twice daily. ICD 10 code E11.9. Dispense type covered by insurance. 03/26/17  Yes Stallings, Zoe A, MD  metFORMIN (GLUCOPHAGE-XR) 750 MG 24 hr tablet TAKE 1 TABLET(750 MG) BY MOUTH DAILY WITH BREAKFAST 02/06/22  Yes Georganna Skeans, MD  rosuvastatin (CRESTOR) 10 MG tablet Take by mouth. 03/28/22  Yes [provider]  albuterol (PROVENTIL HFA) 108 (90 Base) MCG/ACT inhaler     [provider]  fexofenadine (ALLEGRA) 180 MG tablet Take 180 mg by mouth as needed.    [provider]  HYDROcodone bit-homatropine (HYDROMET) 5-1.5 MG/5ML syrup as needed.    [provider]  ibuprofen (ADVIL) 600 MG tablet as needed.    [provider]  montelukast (SINGULAIR) 10 MG tablet Take 1 tablet (10 mg total) by mouth at bedtime. Patient not taking: Reported on 04/19/2022 05/30/21   Guy Sandifer L, PA  naproxen (NAPROSYN) 500 MG tablet     [provider]  scopolamine (TRANSDERM-SCOP) 1 MG/3DAYS Place 1 patch (1.5 mg total) onto the  skin every 3 (three) days. Patient not taking: Reported on 04/19/2022 09/11/21   Georganna Skeans, MD    Current Outpatient Medications  Medication Sig Dispense Refill   Blood Glucose Monitoring Suppl (BLOOD GLUCOSE MONITOR SYSTEM) w/Device KIT Use to check blood glucose twice daily. ICD 10 code E11.9. Dispense type covered by insurance. 1 each 0   glucose blood test strip Use to check blood glucose twice daily. ICD 10 code E11.9. Dispense type covered by insurance. 100  each 12   Lancet Devices (LANCING DEVICE) MISC Use to check blood glucose twice daily. ICD 10 code E11.9. Dispense type covered by insurance. 1 each 1   Lancets 30G MISC Use to check blood glucose twice daily. ICD 10 code E11.9. Dispense type covered by insurance. 100 each 11   metFORMIN (GLUCOPHAGE-XR) 750 MG 24 hr tablet TAKE 1 TABLET(750 MG) BY MOUTH DAILY WITH BREAKFAST 90 tablet 0   rosuvastatin (CRESTOR) 10 MG tablet Take by mouth.     albuterol (PROVENTIL HFA) 108 (90 Base) MCG/ACT inhaler  (Patient not taking: Reported on 04/19/2022)     fexofenadine (ALLEGRA) 180 MG tablet Take 180 mg by mouth as needed.     HYDROcodone bit-homatropine (HYDROMET) 5-1.5 MG/5ML syrup as needed.     ibuprofen (ADVIL) 600 MG tablet as needed.     montelukast (SINGULAIR) 10 MG tablet Take 1 tablet (10 mg total) by mouth at bedtime. (Patient not taking: Reported on 04/19/2022) 30 tablet 2   naproxen (NAPROSYN) 500 MG tablet      scopolamine (TRANSDERM-SCOP) 1 MG/3DAYS Place 1 patch (1.5 mg total) onto the skin every 3 (three) days. (Patient not taking: Reported on 04/19/2022) 4 patch 0   Current Facility-Administered Medications  Medication Dose Route Frequency Provider Last Rate Last Admin   0.9 %  sodium chloride infusion  500 mL Intravenous Once Imogene Burn, MD        Allergies as of 05/29/2022 - Review Complete 05/29/2022  Allergen Reaction Noted   Wound dressing adhesive  04/05/2021   Ciprofloxacin Nausea And Vomiting and Rash 01/04/2014   Tape Rash 11/28/2011    Family History  Problem Relation Age of Onset   Diabetes Mother    Alzheimer's disease Mother    Heart disease Father    Cancer Sister    Cancer Sister        breast ca   Breast cancer Sister 80   Diabetes Sister    Hypertension Sister    Breast cancer Sister    Cancer Sister    Cancer Sister        breast ca   Breast cancer Sister 34   Diabetes Brother    Diabetes Brother    Cancer Brother        prostate   Cancer Brother  81       prostate ca   Colon cancer Neg Hx    Colon polyps Neg Hx    Crohn's disease Neg Hx    Esophageal cancer Neg Hx    Rectal cancer Neg Hx    Stomach cancer Neg Hx    Ulcerative colitis Neg Hx     Social History   Socioeconomic History   Marital status: Widowed    Spouse name: Shawn   Number of children: 3   Years of education: 12   Highest education level: Not on file  Occupational History   Occupation: Engineer, manufacturing    Employer: [employer]  Tobacco Use   Smoking  status: Never   Smokeless tobacco: Never  Vaping Use   Vaping Use: Never used  Substance and Sexual Activity   Alcohol use: Yes    Comment: Very seldom drink alcohol. Maybe 2-3 times/year   Drug use: Never   Sexual activity: Yes    Partners: Male    Birth control/protection: Post-menopausal  Other Topics Concern   Not on file  Social History Narrative   Lives alone. Husband died unexpectedly of a heart attack at work 11/15/2011. Good support from pastor and daughters.   Social Determinants of Health   Financial Resource Strain: Not on file  Food Insecurity: Not on file  Transportation Needs: Not on file  Physical Activity: Not on file  Stress: Not on file  Social Connections: Not on file  Intimate Partner Violence: Not on file    Physical Exam: Vital signs in last 24 hours: BP 119/72 (BP Location: Right Arm, Patient Position: Sitting, Cuff Size: Normal)   Pulse 72   Temp (!) 97.5 F (36.4 C) (Temporal)   Ht 5\' 5"  (1.651 m)   Wt 163 lb (73.9 kg)   SpO2 100%   BMI 27.12 kg/m  GEN: NAD EYE: Sclerae anicteric ENT: MMM CV: Non-tachycardic Pulm: No increased work of breathing GI: Soft, NT/ND NEURO:  Alert & Oriented   Eulah Pont, MD [facility]  05/29/2022 11:12 AM

## 2022-05-29 NOTE — Progress Notes (Signed)
Called to room to assist during endoscopic procedure.  Patient ID and intended procedure confirmed with present staff. Received instructions for my participation in the procedure from the performing physician.  

## 2022-05-30 ENCOUNTER — Telehealth: Payer: Self-pay

## 2022-05-30 NOTE — Telephone Encounter (Signed)
Inbound call from patient returning phone call. Requesting a call back. Please advise, thank you.

## 2022-05-30 NOTE — Telephone Encounter (Signed)
Follow up call placed, no answer and no VM. 

## 2022-05-31 ENCOUNTER — Encounter: Payer: Self-pay | Admitting: Internal Medicine

## 2023-01-03 ENCOUNTER — Other Ambulatory Visit (HOSPITAL_COMMUNITY): Payer: Self-pay

## 2023-01-03 MED ORDER — MOUNJARO 2.5 MG/0.5ML ~~LOC~~ SOAJ
2.5000 mg | SUBCUTANEOUS | 0 refills | Status: DC
  Filled 2023-01-03: qty 2, 28d supply, fill #0

## 2023-01-27 ENCOUNTER — Other Ambulatory Visit (HOSPITAL_COMMUNITY): Payer: Self-pay

## 2023-01-27 MED ORDER — MOUNJARO 5 MG/0.5ML ~~LOC~~ SOAJ
5.0000 mg | SUBCUTANEOUS | 1 refills | Status: DC
  Filled 2023-01-27: qty 2, 28d supply, fill #0
  Filled 2023-02-20: qty 2, 28d supply, fill #1
  Filled 2023-03-21: qty 2, 28d supply, fill #2
  Filled 2023-04-17: qty 2, 28d supply, fill #3
  Filled 2023-05-13: qty 2, 28d supply, fill #4
  Filled 2023-06-12: qty 2, 28d supply, fill #5

## 2023-02-19 ENCOUNTER — Other Ambulatory Visit: Payer: Self-pay | Admitting: Physician Assistant

## 2023-02-19 DIAGNOSIS — Z1231 Encounter for screening mammogram for malignant neoplasm of breast: Secondary | ICD-10-CM

## 2023-04-02 ENCOUNTER — Ambulatory Visit
Admission: RE | Admit: 2023-04-02 | Discharge: 2023-04-02 | Disposition: A | Discharge status: Home / Self Care | Payer: 59 | Source: Ambulatory Visit | Attending: Physician Assistant | Admitting: Physician Assistant

## 2023-04-02 DIAGNOSIS — Z1231 Encounter for screening mammogram for malignant neoplasm of breast: Secondary | ICD-10-CM

## 2023-05-14 ENCOUNTER — Other Ambulatory Visit (HOSPITAL_COMMUNITY): Payer: Self-pay

## 2023-07-16 ENCOUNTER — Other Ambulatory Visit (HOSPITAL_COMMUNITY): Payer: Self-pay

## 2023-07-17 ENCOUNTER — Other Ambulatory Visit (HOSPITAL_COMMUNITY): Payer: Self-pay

## 2023-07-17 MED ORDER — MOUNJARO 5 MG/0.5ML ~~LOC~~ SOAJ
5.0000 mg | SUBCUTANEOUS | 1 refills | Status: DC
  Filled 2023-07-17: qty 2, 28d supply, fill #0
  Filled 2023-08-11: qty 2, 28d supply, fill #1
  Filled 2023-09-07: qty 2, 28d supply, fill #2
  Filled 2023-10-07: qty 2, 28d supply, fill #3
  Filled 2023-10-31: qty 2, 28d supply, fill #4
  Filled 2023-12-01: qty 2, 28d supply, fill #5

## 2023-11-02 NOTE — Progress Notes (Signed)
 PATIENT: Chelsea Mckay MRN:  [id number] DOB:  February 13, 1960 DATE OF SERVICE:  10/31/2023  Referring Physician:  Kingston Christell DEL, MD Primary Physician:  Joesph DEL Cedar, PA-C  Chief Complaint:  Chief Complaint  Patient presents with  . Shoulder Pain    RIGHT shoulder pain     DOS/DOI:       ASSESSMENT / PLAN:   1. Lipoma of right shoulder      2. Mass of joint of right shoulder  Ambulatory referral to Orthopedic Surgery   XR Shoulder 2+ Views Right    3. Bursitis of right shoulder  Large joint injection/arthrocentesis: R subacromial bursa    4. Type 2 diabetes mellitus with other specified complication, without long-term current use of insulin (*)         Reviewed the findings and the recommendations.  Reviewed treatment options and expectations. Assessment & Plan 1. Right shoulder pain: Likely due to rotator cuff inflammation, impingement, or bursitis. X-rays show no significant arthritis. - Administered steroid injection - Recommended Tylenol  or ibuprofen  - Consider further injections or MRI if no improvement in 3-4 weeks  2. Lipoma: Incidental, benign. - Monitor - Possible concurrent removal if shoulder surgery needed  3. Diabetes mellitus: On Mounjaro . - Monitor blood sugar post-steroid injection  Follow-up - Re-evaluate in 3-4 weeks if no improvement  PROCEDURE Procedure Performed Steroid injection in right shoulder  Anesthesia Numbing medicine  Technique Discussed purpose, potential blood sugar elevation, immediate relief from numbing medicine, delayed effect of steroid. Administered from back corner of shoulder. Advised Tylenol  or ibuprofen  for pain relief.    No follow-ups on file.  Orders Placed This Encounter  Procedures  . Large joint injection/arthrocentesis: R subacromial bursa  . XR Shoulder 2+ Views Right       CURRENT HISTORY:   History of Present Illness 63 year old female with right shoulder pain and suspected mass, referred by Dr.  Kingston. Pain noticed with bump appearance, exacerbated by shoulder retraction, occasional upward radiation. History of diabetes, takes Mounjaro .  SOCIAL HISTORY Occupations: Desk job     ROS Nursing review of systems reviewed, edited and corrected.  Referring provider:  Kingston Christell DEL, MD Notes from referring provider were reviewed when available.  PAST HISTORY:   Patient Active Problem List   Diagnosis Date Noted  . Bursitis of right shoulder 11/02/2023    Right shoulder subacromial injection-10/31/2023   . Type 2 diabetes mellitus, without long-term current use of insulin (*) 03/08/2022  . Hyperlipidemia associated with type 2 diabetes mellitus (*) 03/08/2022  . Colon polyp 04/16/2017    Formatting of this note might be different from the original. Done 04/10/17. 4mm polyp, transverse colon. Follow up cscopy in 5 years   . Family history of breast cancer in sister 01/10/2016  . Benign hematuria 11/28/2011   Allergies[1] Social History   Social History Narrative  . Not on file   Past Surgical History:  Procedure Laterality Date  . Bunionectomy     blt  . Tubal ligation     Past Medical History:  Diagnosis Date  . Allergy 2000   Can't remember exact date but Prior to 2007  . Anxiety   . Colon polyp 2012  . Depression    Current Medications[2]  PHYSICAL EXAMINATION:   Physical Exam Pleasant alert and oriented.  No distress.  Right shoulder and upper extremity examined.  2 cm subcutaneous soft mass anteriorly at the shoulder consistent with lipoma.  No AC joint tenderness.  Good active range  of motion of the shoulder.  No adhesions or crepitation.  Reproducible overhead imaging.  Good cuff strength.  Upper extremity neurovascularly intact.    IMAGING STUDIES:   XR Shoulder 2+ Views Right Narrative: IMAGING: Impression: X-RAYS: AP, outlet, and axillary of the RIGHT shoulder. These were taken  and reviewed in the office today.  INTERPRETATION: No acute  findings.  Type I acromion.  Glenohumeral joint  space well-maintained.  No deformity.    RESULTS:   Imaging studies ordered and interpreted in the office today.  Pertinent prior imaging studies reviewed today.  Results Imaging: Right shoulder x-ray shows no significant arthritis, minor collar bone joint involvement.    PROCEDURE:   Procedures performed in the office today may be documented in a separate procedure note.  Procedures     Herbalist was used to create visit note.  Consent from the patient/care provider was obtained prior to its use.  Portions of the history and exam were entered using voice recognition software.  Minor syntax, contextual, and spelling errors may be related to the use of this software and were not intentional.  If corrections are necessary, please contact provider.        [1] Allergies Allergen Reactions  . Dulcolax Shortness Of Breath and Other    Drench in sweat, off balance  . Ciprofloxacin Nausea And Vomiting and Rash  . Miralax [Polyethylene Glycol] Dizziness  . Wound Dressing Adhesive Rash  [2] Current Outpatient Medications  Medication Sig Dispense Refill  . montelukast  (SINGULAIR ) 10 MG tablet Take one tablet (10 mg dose) by mouth daily. 30 tablet 2  . MOUNJARO  5 mg/0.5 mL SOAJ pen injection Inject 5 mg into the skin once a week. 6 mL 1  . rosuvastatin calcium  (CRESTOR) 10 mg tablet Take one tablet (10 mg dose) by mouth daily. 90 tablet 3   No current facility-administered medications for this visit.

## 2023-12-01 ENCOUNTER — Other Ambulatory Visit (HOSPITAL_COMMUNITY): Payer: Self-pay

## 2023-12-01 MED ORDER — MOUNJARO 5 MG/0.5ML ~~LOC~~ SOAJ
5.0000 mg | SUBCUTANEOUS | 1 refills | Status: AC
  Filled 2023-12-01 – 2023-12-26 (×2): qty 2, 28d supply, fill #0
  Filled 2024-01-22: qty 2, 28d supply, fill #1
  Filled 2024-02-18: qty 2, 28d supply, fill #2

## 2023-12-09 ENCOUNTER — Encounter: Payer: Self-pay | Admitting: General Surgery

## 2023-12-09 ENCOUNTER — Ambulatory Visit: Admitting: General Surgery

## 2023-12-09 VITALS — BP 109/72 | HR 68 | Ht 65.0 in | Wt 132.0 lb

## 2023-12-09 DIAGNOSIS — R2231 Localized swelling, mass and lump, right upper limb: Secondary | ICD-10-CM

## 2023-12-09 NOTE — Patient Instructions (Signed)
 We will schedule an excision of the area in office. You do not need a driver that day but may have someone with you.    Lipoma Removal Lipoma removal is a surgical procedure to remove a noncancerous (benign) tumor that is made up of fat cells (lipoma). Most lipomas are small and painless and do not require treatment. They can form in many areas of the body but are most common under the skin of the back, shoulders, arms, and thighs. You may need lipoma removal if you have a lipoma that is large, growing, or causing discomfort. Lipoma removal may also be done for cosmetic reasons. Tell a health care provider about: Any allergies you have. All medicines you are taking, including vitamins, herbs, eye drops, creams, and over-the-counter medicines. Any problems you or family members have had with anesthetic medicines. Any blood disorders you have. Any surgeries you have had. Any medical conditions you have. Whether you are pregnant or may be pregnant. What are the risks? Generally, this is a safe procedure. However, problems may occur, including: Infection. Bleeding. Allergic reactions to medicines. Damage to nerves or blood vessels near the lipoma. Scarring.  Medicines Ask your health care provider about: Changing or stopping your regular medicines. This is especially important if you are taking diabetes medicines or blood thinners. Taking medicines such as aspirin and ibuprofen . These medicines can thin your blood. Do not take these medicines before your procedure if your health care provider instructs you not to. You may be given antibiotic medicine to help prevent infection. General instructions Ask your health care provider how your surgical site will be marked or identified. You will have a physical exam. Your health care provider will check the size of the lipoma and whether it can be moved easily.  What happens during the procedure? To reduce your risk of infection: Your health  care team will wash or sanitize their hands. Your skin will be washed with surgical soap. You will be given the following: A medicine to numb the area (local anesthetic). An incision will be made over the lipoma or very near the lipoma. The incision may be made in a natural skin line or crease. Tissues, nerves, and blood vessels near the lipoma will be moved out of the way. The lipoma and the capsule that surrounds it will be separated from the surrounding tissues. The lipoma will be removed. The incision may be closed with stitches and surgical glue

## 2023-12-23 NOTE — Progress Notes (Signed)
 Patient ID: Chelsea Mckay, female   DOB: [birth year], 63 y.o.   MRN: [id number] CC: Right Shoulder Mass History of Present Illness Chelsea Mckay is a 63 y.o. female with past medical history as below who presents in consultation for right shoulder mass.  The patient reports that she has had it for a number of years.  She says that it has grown a little bit.  She denies any overlying skin changes.  She does have some pain with it.  She denies any drainage from the area..  Past Medical History Past Medical History:  Diagnosis Date   Allergy    Anxiety    Asthma    Asthmatic bronchitis    Cataract    FORMING   Chest pressure    Colon polyp 04/16/2017   Done 04/10/17. 4mm polyp, transverse colon. Follow up cscopy in 5 years   Depression    Headache(784.0)    Barometric pressure migraine headaches   Hyperlipidemia associated with type 2 diabetes mellitus (HCC) 03/08/2022   Kidney stones        Past Surgical History:  Procedure Laterality Date   BUNIONECTOMY  01/14/1997   CERVICAL POLYPECTOMY  01/14/2005   COLONOSCOPY     COLONOSCOPY W/ POLYPECTOMY     DILATATION & CURETTAGE/HYSTEROSCOPY WITH TRUECLEAR N/A 09/24/2013   Procedure: DILATATION & CURETTAGE/HYSTEROSCOPY WITH TRUCLEAR and Endometrial Ablation;  Surgeon: Ovid DELENA All, MD;  Location: [facility];  Service: Gynecology;  Laterality: N/A;   TUBAL LIGATION  01/14/1985    Allergies  Allergen Reactions   Wound Dressing Adhesive     Other reaction(s): Unknown   Ciprofloxacin Nausea And Vomiting and Rash   Tape Rash    Current Outpatient Medications  Medication Sig Dispense Refill   albuterol  (PROVENTIL  HFA) 108 (90 Base) MCG/ACT inhaler  (Patient taking differently: 1-2 puffs every 6 (six) hours as needed.)     Blood Glucose Monitoring Suppl (BLOOD GLUCOSE MONITOR SYSTEM) w/Device KIT Use to check blood glucose twice daily. ICD 10 code E11.9. Dispense type covered by insurance. 1 each 0   fexofenadine  (ALLEGRA ) 180 MG tablet  Take 180 mg by mouth as needed.     glucose blood test strip Use to check blood glucose twice daily. ICD 10 code E11.9. Dispense type covered by insurance. 100 each 12   HYDROcodone  bit-homatropine (HYDROMET) 5-1.5 MG/5ML syrup as needed.     ibuprofen  (ADVIL ) 600 MG tablet as needed.     Lancet Devices (LANCING DEVICE) MISC Use to check blood glucose twice daily. ICD 10 code E11.9. Dispense type covered by insurance. 1 each 1   Lancets 30G MISC Use to check blood glucose twice daily. ICD 10 code E11.9. Dispense type covered by insurance. 100 each 11   montelukast  (SINGULAIR ) 10 MG tablet Take 1 tablet (10 mg total) by mouth at bedtime. 30 tablet 2   naproxen (NAPROSYN) 500 MG tablet  (Patient taking differently: Take 500 mg by mouth as needed.)     rosuvastatin (CRESTOR) 10 MG tablet Take by mouth.     scopolamine  (TRANSDERM-SCOP) 1 MG/3DAYS Place 1 patch (1.5 mg total) onto the skin every 3 (three) days. 4 patch 0   tirzepatide  (MOUNJARO ) 5 MG/0.5ML Pen Inject 5 mg into the skin once a week. 6 mL 1   Current Facility-Administered Medications  Medication Dose Route Frequency Provider Last Rate Last Admin   ondansetron  (ZOFRAN ) 4 mg in sodium chloride  0.9 % 50 mL IVPB  4 mg Intravenous Once Dorsey, Ying  C, MD        Family History Family History  Problem Relation Age of Onset   Diabetes Mother    Alzheimer's disease Mother    Heart disease Father    Cancer Sister    Cancer Sister 79       breast ca   Breast cancer Sister 26   Diabetes Sister    Hypertension Sister    Breast cancer Sister    Cancer Sister    Cancer Sister 45       breast ca   Breast cancer Sister 73   Diabetes Brother    Diabetes Brother    Cancer Brother 49       prostate   Cancer Brother 33       prostate ca   Colon cancer Neg Hx    Colon polyps Neg Hx    Crohn's disease Neg Hx    Esophageal cancer Neg Hx    Rectal cancer Neg Hx    Stomach cancer Neg Hx    Ulcerative colitis Neg Hx        Social  History Social History   Tobacco Use   Smoking status: Never    Passive exposure: Never   Smokeless tobacco: Never  Vaping Use   Vaping status: Never Used  Substance Use Topics   Alcohol use: Yes    Comment: Very seldom drink alcohol. Maybe 2-3 times/year   Drug use: Never        ROS Full ROS of systems performed and is otherwise negative there than what is stated in the HPI  Physical Exam Blood pressure 109/72, pulse 68, height 5' 5 (1.651 m), weight 132 lb (59.9 kg), last menstrual period 04/23/2017, SpO2 99%.  Alert and oriented x 3, normal work of breathing on room air, regular rate and rhythm, abdomen soft, nontender nondistended, right shoulder there is a soft tissue mass that is mobile and minimally tender to touch.  There is no overlying skin changes.  Data Reviewed  I have personally reviewed the patient's imaging and medical records.    Assessment    Patient with right upper extremity soft tissue mass  Plan    Will plan for excision in the office.  Discussed risk, benefits alternatives of the procedure including risk of infection, bleeding, recurrence, skin complications.  She understands his risk and wishes to proceed with excision.  A total of 45 minutes was spent reviewing the patient's chart, performing history and physical and discussing treatment options with the patient  Jayson MALVA Endow

## 2023-12-25 ENCOUNTER — Other Ambulatory Visit: Payer: Self-pay | Admitting: General Surgery

## 2023-12-25 ENCOUNTER — Ambulatory Visit: Admitting: General Surgery

## 2023-12-25 ENCOUNTER — Encounter: Payer: Self-pay | Admitting: General Surgery

## 2023-12-25 VITALS — BP 131/73 | HR 66 | Temp 98.4°F | Ht 65.0 in | Wt 130.0 lb

## 2023-12-25 DIAGNOSIS — R2231 Localized swelling, mass and lump, right upper limb: Secondary | ICD-10-CM

## 2023-12-25 DIAGNOSIS — D1721 Benign lipomatous neoplasm of skin and subcutaneous tissue of right arm: Secondary | ICD-10-CM | POA: Diagnosis not present

## 2023-12-25 NOTE — Patient Instructions (Signed)
 Today we have removed a Lipoma in our office. Please see information below regarding this type of tumor.  You are free to shower 12/26/2023. Do not scrub at the area.   You have glue on your skin and sutures under the skin. The glue will come off on it's own in 10-14 days. You may shower normally until this occurs but do not submerge.  Please use Tylenol  or Ibuprofen  for pain as needed. You may use ice to the area 3-4 times today and tomorrow for any achiness.   We will see you back in 7-10 days to ensure that this has healed and to review the final pathology. Please see your appointment below. You may continue your regular activities right away but if you are having pain while doing something, stop what you are doing and try this activity once again in 3 days. Please call our office with any questions or concerns prior to your appointment.  Call to report any excessive bleeding, spreading redness, or increased pain.    Lipoma Removal Lipoma removal is a surgical procedure to remove a noncancerous (benign) tumor that is made up of fat cells (lipoma). Most lipomas are small and painless and do not require treatment. They can form in many areas of the body but are most common under the skin of the back, shoulders, arms, and thighs. You may need lipoma removal if you have a lipoma that is large, growing, or causing discomfort. Lipoma removal may also be done for cosmetic reasons. Tell a health care provider about: Any allergies you have. All medicines you are taking, including vitamins, herbs, eye drops, creams, and over-the-counter medicines. Any problems you or family members have had with anesthetic medicines. Any blood disorders you have. Any surgeries you have had. Any medical conditions you have. Whether you are pregnant or may be pregnant. What are the risks? Generally, this is a safe procedure. However, problems may occur, including: Infection. Bleeding. Allergic reactions to  medicines. Damage to nerves or blood vessels near the lipoma. Scarring.  Medicines Ask your health care provider about: Changing or stopping your regular medicines. This is especially important if you are taking diabetes medicines or blood thinners. Taking medicines such as aspirin and ibuprofen . These medicines can thin your blood. Do not take these medicines before your procedure if your health care provider instructs you not to. You may be given antibiotic medicine to help prevent infection. General instructions Ask your health care provider how your surgical site will be marked or identified. You will have a physical exam. Your health care provider will check the size of the lipoma and whether it can be moved easily.  What happens during the procedure? To reduce your risk of infection: Your health care team will wash or sanitize their hands. Your skin will be washed with surgical soap. You will be given the following: A medicine to numb the area (local anesthetic). An incision will be made over the lipoma or very near the lipoma. The incision may be made in a natural skin line or crease. Tissues, nerves, and blood vessels near the lipoma will be moved out of the way. The lipoma and the capsule that surrounds it will be separated from the surrounding tissues. The lipoma will be removed. The incision may be closed with stitches and surgical glue

## 2023-12-26 ENCOUNTER — Other Ambulatory Visit (HOSPITAL_COMMUNITY): Payer: Self-pay

## 2023-12-29 LAB — SURGICAL PATHOLOGY

## 2023-12-29 NOTE — Progress Notes (Signed)
 Procedure note  Preoperative diagnosis: Right shoulder lipoma Postoperative diagnosis: Right shoulder lipoma measuring approximately 3-1/2 cm  After informed consent was obtained the patient was placed supine on our procedure room table.  Her right shoulder was then prepped and draped in the usual sterile fashion.  Surgical timeout was called identifying correct patient, site, side and procedure.  Patient comfort was obtained with infiltration of 10 cc of 1% lidocaine  with epinephrine.  An incision was made over the soft tissue mass.  He was dissected circumferentially from the surrounding subcutaneous tissue.  This was dissected and was able to be eviscerated from the body and it was removed intact.  It appeared to be a lipoma.  It was then sent for pathology.  The cavity was then irrigated with warm saline solution.  The deep dermal layer was closed with 3-0 Vicryl and the skin was closed with 4-0 Monocryl dressed with glue.  The patient tolerated the procedure well

## 2024-01-06 ENCOUNTER — Ambulatory Visit: Admitting: General Surgery

## 2024-01-06 ENCOUNTER — Encounter: Payer: Self-pay | Admitting: General Surgery

## 2024-01-06 VITALS — BP 117/72 | HR 66 | Ht 65.0 in | Wt 131.0 lb

## 2024-01-06 DIAGNOSIS — D1721 Benign lipomatous neoplasm of skin and subcutaneous tissue of right arm: Secondary | ICD-10-CM

## 2024-01-06 DIAGNOSIS — R2231 Localized swelling, mass and lump, right upper limb: Secondary | ICD-10-CM

## 2024-01-06 DIAGNOSIS — Z09 Encounter for follow-up examination after completed treatment for conditions other than malignant neoplasm: Secondary | ICD-10-CM

## 2024-01-06 NOTE — Progress Notes (Signed)
 Patient status post right shoulder lipoma excision.  She reports doing well.  She says that she had very little pain from the procedure.  She denies any erythema from the wound.  Her glue is starting to peel off.  On exam right shoulder wound is healing well without any erythema.  There continues to be some glue around the incision.  Pathology consistent with a lipoma.  Discussed with her that she can put scar cream on the area once the glue peels off which may take another week to 2 weeks.  She can follow-up with us  as needed

## 2024-01-06 NOTE — Patient Instructions (Signed)
 May rub Vitamin-E oil, cocoa butter, or other emmolient agent in area 2-3 times a day to soften after the glue comes off. You will need to use sunscreen for the next year on the area to minimize altered pigmentation of the site.    Follow-up with our office as needed.  Please call and ask to speak with a nurse if you develop questions or concerns.
